# Patient Record
Sex: Male | Born: 1945 | Race: White | Hispanic: No | Marital: Single | Smoking: Former smoker
Health system: Southern US, Community
[De-identification: ages and names within clinical notes are randomized; demographics above are authoritative.]

## PROBLEM LIST (undated history)

## (undated) DIAGNOSIS — Z8489 Family history of other specified conditions: Secondary | ICD-10-CM

## (undated) DIAGNOSIS — I499 Cardiac arrhythmia, unspecified: Secondary | ICD-10-CM

## (undated) DIAGNOSIS — G4730 Sleep apnea, unspecified: Secondary | ICD-10-CM

## (undated) DIAGNOSIS — M109 Gout, unspecified: Secondary | ICD-10-CM

## (undated) DIAGNOSIS — M5136 Other intervertebral disc degeneration, lumbar region: Secondary | ICD-10-CM

## (undated) DIAGNOSIS — Z9289 Personal history of other medical treatment: Secondary | ICD-10-CM

## (undated) DIAGNOSIS — M1990 Unspecified osteoarthritis, unspecified site: Secondary | ICD-10-CM

## (undated) DIAGNOSIS — R0600 Dyspnea, unspecified: Secondary | ICD-10-CM

## (undated) DIAGNOSIS — I10 Essential (primary) hypertension: Secondary | ICD-10-CM

## (undated) DIAGNOSIS — N189 Chronic kidney disease, unspecified: Secondary | ICD-10-CM

## (undated) DIAGNOSIS — G459 Transient cerebral ischemic attack, unspecified: Secondary | ICD-10-CM

## (undated) HISTORY — PX: COLONOSCOPY: SHX5424

## (undated) HISTORY — PX: CHOLECYSTECTOMY: SHX55

## (undated) HISTORY — PX: CARDIAC CATHETERIZATION: SHX172

## (undated) HISTORY — PX: ABLATION: SHX5711

## (undated) HISTORY — PX: BACK SURGERY: SHX140

## (undated) HISTORY — PX: EYE SURGERY: SHX253

## (undated) HISTORY — PX: LEG SURGERY: SHX1003

## (undated) HISTORY — PX: TIBIA FRACTURE SURGERY: SHX806

---

## 2016-06-07 HISTORY — PX: JOINT REPLACEMENT: SHX530

## 2018-01-31 ENCOUNTER — Other Ambulatory Visit: Payer: Self-pay | Admitting: Neurosurgery

## 2018-01-31 DIAGNOSIS — S32009K Unspecified fracture of unspecified lumbar vertebra, subsequent encounter for fracture with nonunion: Secondary | ICD-10-CM

## 2018-02-02 ENCOUNTER — Ambulatory Visit
Admission: RE | Admit: 2018-02-02 | Discharge: 2018-02-02 | Disposition: A | Discharge status: Home / Self Care | Payer: Medicare HMO | Source: Ambulatory Visit | Attending: Neurosurgery | Admitting: Neurosurgery

## 2018-02-02 DIAGNOSIS — S32009K Unspecified fracture of unspecified lumbar vertebra, subsequent encounter for fracture with nonunion: Secondary | ICD-10-CM

## 2018-02-08 ENCOUNTER — Other Ambulatory Visit: Payer: Self-pay | Admitting: Neurosurgery

## 2018-02-17 NOTE — Pre-Procedure Instructions (Signed)
Ryan Stone A Frede  02/17/2018      [id number] - [place], [place] - [address] [place] [id number] Phone: [phone] Fax: [phone]    Your procedure is scheduled on Friday September 20.  [facility] to [facility] Ad[facility] at 8:40 A.M.  Call this number if you have problems the morning of surgery:  [phone]   Remember:  Do not eat or drink after midnight.    Take these medicines the morning of surgery with A SIP OF WATER:   Carvedilol (coreg) Allopurinol (zyloprim) Amiodarone (pacerone) Gabapentin (neurontin) Hydralazine (apresoline) Levothyroxine (synthroid) Isosorbide (Imdur) Dutesteride (Avodart) Eye drops  7 days prior to surgery STOP taking any Aspirin(unless otherwise instructed by your surgeon), Aleve, Naproxen, Ibuprofen, Motrin, Advil, Goody's, BC's, all herbal medications, fish oil, and all vitamins     Do not wear jewelry, make-up or nail polish.  Do not wear lotions, powders, or perfumes, or deodorant.  Do not shave 48 hours prior to surgery.  Men may shave face and neck.  Do not bring valuables to the hospital.  [facility] is not responsible for any belongings or valuables.  Contacts, dentures or bridgework may not be worn into surgery.  Leave your suitcase in the car.  After surgery it may be brought to your room.  For patients admitted to the hospital, discharge time will be determined by your treatment team.  Patients discharged the day of surgery will not be allowed to drive home.   Special instructions:    [facility]- Preparing For Surgery  Before surgery, you can play an important role. Because skin is not sterile, your skin needs to be as free of germs as possible. You can reduce the number of germs on your skin by washing with CHG (chlorahexidine gluconate) Soap before surgery.  CHG is an antiseptic cleaner which kills germs and bonds with the skin to continue killing germs even after washing.    Oral  Hygiene is also important to reduce your risk of infection.  Remember - BRUSH YOUR TEETH THE MORNING OF SURGERY WITH YOUR REGULAR TOOTHPASTE  Please do not use if you have an allergy to CHG or antibacterial soaps. If your skin becomes reddened/irritated stop using the CHG.  Do not shave (including legs and underarms) for at least 48 hours prior to first CHG shower. It is OK to shave your face.  Please follow these instructions carefully.   1. Shower the NIGHT BEFORE SURGERY and the MORNING OF SURGERY with CHG.   2. If you chose to wash your hair, wash your hair first as usual with your normal shampoo.  3. After you shampoo, rinse your hair and body thoroughly to remove the shampoo.  4. Use CHG as you would any other liquid soap. You can apply CHG directly to the skin and wash gently with a scrungie or a clean washcloth.   5. Apply the CHG Soap to your body ONLY FROM THE NECK DOWN.  Do not use on open wounds or open sores. Avoid contact with your eyes, ears, mouth and genitals (private parts). Wash Face and genitals (private parts)  with your normal soap.  6. Wash thoroughly, paying special attention to the area where your surgery will be performed.  7. Thoroughly rinse your body with warm water from the neck down.  8. DO NOT shower/wash with your normal soap after using and rinsing off the CHG Soap.  9. Pat yourself dry with a CLEAN TOWEL.  10. Wear CLEAN PAJAMAS to bed the night before surgery, wear comfortable clothes the morning of surgery  11. Place CLEAN SHEETS on your bed the night of your first shower and DO NOT SLEEP WITH PETS.    Day of Surgery:  Do not apply any deodorants/lotions.  Please wear clean clothes to the hospital/surgery center.   Remember to brush your teeth WITH YOUR REGULAR TOOTHPASTE.    Please read over the following fact sheets that you were given. Coughing and Deep Breathing, MRSA Information and Surgical Site Infection Prevention

## 2018-02-20 ENCOUNTER — Encounter (HOSPITAL_COMMUNITY): Payer: Self-pay

## 2018-02-20 ENCOUNTER — Encounter (HOSPITAL_COMMUNITY)
Admission: RE | Admit: 2018-02-20 | Discharge: 2018-02-20 | Disposition: A | Discharge status: Home / Self Care | Payer: Medicare HMO | Source: Ambulatory Visit | Attending: Neurosurgery | Admitting: Neurosurgery

## 2018-02-20 ENCOUNTER — Other Ambulatory Visit: Payer: Self-pay

## 2018-02-20 DIAGNOSIS — Z8673 Personal history of transient ischemic attack (TIA), and cerebral infarction without residual deficits: Secondary | ICD-10-CM | POA: Diagnosis not present

## 2018-02-20 DIAGNOSIS — N189 Chronic kidney disease, unspecified: Secondary | ICD-10-CM | POA: Diagnosis not present

## 2018-02-20 DIAGNOSIS — M40209 Unspecified kyphosis, site unspecified: Secondary | ICD-10-CM | POA: Diagnosis not present

## 2018-02-20 DIAGNOSIS — Z9841 Cataract extraction status, right eye: Secondary | ICD-10-CM | POA: Insufficient documentation

## 2018-02-20 DIAGNOSIS — M109 Gout, unspecified: Secondary | ICD-10-CM | POA: Insufficient documentation

## 2018-02-20 DIAGNOSIS — I10 Essential (primary) hypertension: Secondary | ICD-10-CM | POA: Diagnosis not present

## 2018-02-20 DIAGNOSIS — Z01818 Encounter for other preprocedural examination: Secondary | ICD-10-CM | POA: Insufficient documentation

## 2018-02-20 DIAGNOSIS — I129 Hypertensive chronic kidney disease with stage 1 through stage 4 chronic kidney disease, or unspecified chronic kidney disease: Secondary | ICD-10-CM | POA: Diagnosis not present

## 2018-02-20 DIAGNOSIS — G4733 Obstructive sleep apnea (adult) (pediatric): Secondary | ICD-10-CM | POA: Diagnosis not present

## 2018-02-20 DIAGNOSIS — Z9842 Cataract extraction status, left eye: Secondary | ICD-10-CM | POA: Insufficient documentation

## 2018-02-20 DIAGNOSIS — M1990 Unspecified osteoarthritis, unspecified site: Secondary | ICD-10-CM | POA: Insufficient documentation

## 2018-02-20 DIAGNOSIS — Z87891 Personal history of nicotine dependence: Secondary | ICD-10-CM | POA: Diagnosis not present

## 2018-02-20 DIAGNOSIS — Z79890 Hormone replacement therapy (postmenopausal): Secondary | ICD-10-CM | POA: Insufficient documentation

## 2018-02-20 DIAGNOSIS — Z79899 Other long term (current) drug therapy: Secondary | ICD-10-CM | POA: Diagnosis not present

## 2018-02-20 DIAGNOSIS — Z981 Arthrodesis status: Secondary | ICD-10-CM | POA: Diagnosis not present

## 2018-02-20 HISTORY — DX: Other intervertebral disc degeneration, lumbar region: M51.36

## 2018-02-20 HISTORY — DX: Sleep apnea, unspecified: G47.30

## 2018-02-20 HISTORY — DX: Essential (primary) hypertension: I10

## 2018-02-20 HISTORY — DX: Dyspnea, unspecified: R06.00

## 2018-02-20 HISTORY — DX: Cardiac arrhythmia, unspecified: I49.9

## 2018-02-20 HISTORY — DX: Family history of other specified conditions: Z84.89

## 2018-02-20 HISTORY — DX: Personal history of other medical treatment: Z92.89

## 2018-02-20 HISTORY — DX: Gout, unspecified: M10.9

## 2018-02-20 HISTORY — DX: Transient cerebral ischemic attack, unspecified: G45.9

## 2018-02-20 HISTORY — DX: Unspecified osteoarthritis, unspecified site: M19.90

## 2018-02-20 HISTORY — DX: Chronic kidney disease, unspecified: N18.9

## 2018-02-20 LAB — BASIC METABOLIC PANEL
ANION GAP: 7 (ref 5–15)
BUN: 18 mg/dL (ref 8–23)
CALCIUM: 9.2 mg/dL (ref 8.9–10.3)
CO2: 31 mmol/L (ref 22–32)
Chloride: 102 mmol/L (ref 98–111)
Creatinine, Ser: 0.93 mg/dL (ref 0.61–1.24)
GLUCOSE: 98 mg/dL (ref 70–99)
POTASSIUM: 3.6 mmol/L (ref 3.5–5.1)
Sodium: 140 mmol/L (ref 135–145)

## 2018-02-20 LAB — CBC WITH DIFFERENTIAL/PLATELET
Abs Immature Granulocytes: 0 10*3/uL (ref 0.0–0.1)
BASOS ABS: 0 10*3/uL (ref 0.0–0.1)
Basophils Relative: 1 %
EOS ABS: 0.1 10*3/uL (ref 0.0–0.7)
EOS PCT: 3 %
HEMATOCRIT: 35.9 % — AB (ref 39.0–52.0)
Hemoglobin: 11.4 g/dL — ABNORMAL LOW (ref 13.0–17.0)
Immature Granulocytes: 1 %
Lymphocytes Relative: 17 %
Lymphs Abs: 0.7 10*3/uL (ref 0.7–4.0)
MCH: 31.8 pg (ref 26.0–34.0)
MCHC: 31.8 g/dL (ref 30.0–36.0)
MCV: 100 fL (ref 78.0–100.0)
MONO ABS: 0.5 10*3/uL (ref 0.1–1.0)
Monocytes Relative: 13 %
Neutro Abs: 2.6 10*3/uL (ref 1.7–7.7)
Neutrophils Relative %: 65 %
Platelets: 171 10*3/uL (ref 150–400)
RBC: 3.59 MIL/uL — AB (ref 4.22–5.81)
RDW: 15.8 % — AB (ref 11.5–15.5)
WBC: 4 10*3/uL (ref 4.0–10.5)

## 2018-02-20 LAB — SURGICAL PCR SCREEN
MRSA, PCR: NEGATIVE
STAPHYLOCOCCUS AUREUS: NEGATIVE

## 2018-02-20 LAB — ABO/RH: ABO/RH(D): O POS

## 2018-02-20 LAB — TYPE AND SCREEN
ABO/RH(D): O POS
ANTIBODY SCREEN: NEGATIVE

## 2018-02-20 NOTE — Pre-Procedure Instructions (Addendum)
Ryan [name]  02/20/2018      [id number] - [place], [place] - [address] [place] [address] Phone: [phone] Fax: [phone]    Your procedure is scheduled on Friday September 20.  Report to [name] Admitting at 8:40 A.M.  Call this number if you have problems the morning of surgery:  [phone]   Remember:  Do not eat or drink after midnight Thursday, September 19.    Take these medicines the morning of surgery with A SIP OF WATER:   Carvedilol (coreg) Allopurinol (zyloprim) Amiodarone (pacerone) Gabapentin (neurontin) Hydralazine (apresoline) Levothyroxine (synthroid) Isosorbide (Imdur) Dutesteride (Avodart) Eye drops    7 days prior to surgery STOP taking any Aspirin(unless otherwise instructed by your surgeon), Aleve, Naproxen, Ibuprofen, Motrin, Advil, Goody's, BC's, all herbal medications, fish oil, and all vitamins  Special instructions:    [name]- Preparing For Surgery  Before surgery, you can play an important role. Because skin is not sterile, your skin needs to be as free of germs as possible. You can reduce the number of germs on your skin by washing with CHG (chlorahexidine gluconate) Soap before surgery.  CHG is an antiseptic cleaner which kills germs and bonds with the skin to continue killing germs even after washing.    Oral Hygiene is also important to reduce your risk of infection.  Remember - BRUSH YOUR TEETH THE MORNING OF SURGERY WITH YOUR REGULAR TOOTHPASTE  Please do not use if you have an allergy to CHG or antibacterial soaps. If your skin becomes reddened/irritated stop using the CHG.  Do not shave (including legs and underarms) for at least 48 hours prior to first CHG shower. It is OK to shave your face.  Please follow these instructions carefully.   1. Shower the NIGHT BEFORE SURGERY and the MORNING OF SURGERY with CHG.   2. If you chose to wash your hair, wash your hair first as usual  with your normal shampoo.  3. After you shampoo, rinse your hair and body thoroughly to remove the shampoo.  4. Use CHG as you would any other liquid soap. You can apply CHG directly to the skin and wash gently with a scrungie or a clean washcloth.   5. Apply the CHG Soap to your body ONLY FROM THE NECK DOWN.  Do not use on open wounds or open sores. Avoid contact with your eyes, ears, mouth and genitals (private parts). Wash Face and genitals (private parts)  with your normal soap.  6. Wash thoroughly, paying special attention to the area where your surgery will be performed.  7. Thoroughly rinse your body with warm water from the neck down.  8. DO NOT shower/wash with your normal soap after using and rinsing off the CHG Soap.  9. Pat yourself dry with a CLEAN TOWEL.  10. Wear CLEAN PAJAMAS to bed the night before surgery, wear comfortable clothes the morning of surgery  11. Place CLEAN [name] on your bed the night of your first shower and DO NOT SLEEP WITH PETS.  Day of Surgery:  Shower as above. Do not apply any deodorants/lotions, powders or colognes. Please wear clean clothes to the [name]/surgery center.   Remember to brush your teeth WITH YOUR REGULAR TOOTHPASTE.             Do not wear jewelry, make-up or nail polish.  Do not save 48 hours prior to surgery.  Men may shave face and neck.  Do  not bring valuables to the [name].  [name] is not responsible for any belongings or valuables.  Contacts, dentures or bridgework may not be worn into surgery.  Leave your suitcase in the car.  After surgery it may be brought to your room.  For patients admitted to the [name], discharge time will be determined by your treatment team.  Patients discharged the day of surgery will not be allowed to drive home.    Contacts, dentures or bridgework may not be worn into surgery.  Leave your suitcase in the car.  After surgery it may be brought to your room.  For patients admitted to  the [name], discharge time will be determined by your treatment team.  Patients discharged the day of surgery will not be allowed to drive home.    Please read over the following fact [name] that you were given. Coughing and Deep Breathing, MRSA Information and Surgical Site Infection Prevention

## 2018-02-20 NOTE — Progress Notes (Addendum)
Ryan Ryan [name] denies chest pain or shortness of breath.  Patient has a history of Afib/Aflutter, had a successful cardioversion.  Cardiologist is Dr Willeen CassWahid. PCP is Dr Nolen MuMcKInney.  Patient lives alone, walks with a cane.  Son, Ryan [name] helps as needed with shower.  Ryan [name]'s blood pressures on arrival to PAT left arm; 204/80, 204/90; right Arm 210/73.  At the end of PAT visit 198/73.  Patient reports that he did not take blood pressure medications this am

## 2018-02-21 NOTE — Progress Notes (Signed)
Anesthesia Chart Review:  Case:  [id number] Date/Time:  02/24/18 1025   Procedure:  Posterior Lateral Fusion T9 - L3-4 (N/A Back)   Anesthesia type:  General   Pre-op diagnosis:  Kyphosis   Location:  [facility] ROOM 19 / [facility] OR   Surgeon:  Julio Sicks, MD      DISCUSSION: 72 yo male former smoker for above procedure. Pertinent hx includes HTN, Afib/flutter (s/p cardioversion 2018), OSA not on CPAP, TIA, DOE, s/p bilateral cataract extractions 02/14/18 and 01/24/18 at [facility] without complications.  Pt recently admitted to [facility] 5/4-5/04/2018 for TIA, Opioid dependence with withdrawal, Slurred speech, Acute kidney injury, Hyponatremia, Acute hypercapnic respiratory failure, Essential hypertension/hypertensive urgency. CT head showed no acute abnormalities.  CTA head and neck showed areas of stenosis involving proximal bilateral vertebral artery and proximal left ICA at less than 50%; no large vessel occlusion or high-grade stenosis.  Patient's symptoms of slurred speech improved, however he continued to have some confusion and agitation. Pain medications were initially held with prn IV Lorazepam for agitation.  Patient's mental status improved gradually. He was evaluated by Psychiatry and was restarted on low dose opioids to prevent withdrawals. Patient was discharged on low-dose Oxycodone and was instructed to f/u with the Pain Clinic.   Significantly elevated BP at PAT. Pt reported he has not taken BP meds that morning. Pt last seen by cardiology 11/01/2017. Notes in care everywhere indicate at that time his HTN was well controlled. EKG with sinus brady and first degree HB noted.  Anticipate he can proceed with surgery as planned barring acute status change.    VS: BP (!) 204/96 Comment: taken manually in L arm/large cuff/notified Jan RN  Pulse (!) 56   Temp (!) 36.3 C   Resp 20   Ht 5\' 11"  (1.803 m)   Wt 102.1 kg   SpO2 99%   BMI 31.38 kg/m   PROVIDERS: Margaree Mackintosh, MD is  PCP  Lorelei Pont, MD is Cardiologist last seen 11/01/2017  LABS: Labs reviewed: Acceptable for surgery. (all labs ordered are listed, but only abnormal results are displayed)  Labs Reviewed  CBC WITH DIFFERENTIAL/PLATELET - Abnormal; Notable for the following components:      Result Value   RBC 3.59 (*)    Hemoglobin 11.4 (*)    HCT 35.9 (*)    RDW 15.8 (*)    All other components within normal limits  SURGICAL PCR SCREEN  BASIC METABOLIC PANEL  TYPE AND SCREEN  ABO/RH     IMAGES: XR CHEST PA AND LATERAL 11/02/2017 INDICATION: Weakness COMPARISON: CT 10/09/2017. Chest x-ray 11/02/2017 1 hour prior TECHNIQUE: Frontal and lateral chest radiographs performed.   FINDINGS:   LUNGS:  Minimal vascular congestion in lung bases. No focal consolidation. Trace pleural effusion. PNEUMOTHORAX: None.   HEART SIZE: Cardiomegaly.    IMPRESSION: Cardiomegaly and minimal vascular congestion.  EKG: 11/02/2017 (outside record, copy on pt chart): Sinus rhythm with 1st degree AV block. Nonspecific intraventricular conduction delay.  CV: TTE 10/10/2017: Interpretation Summary A complete two-dimensional transthoracic echocardiogram was performed (2D, M-mode, Doppler and color flow Doppler). There is mild concentric left ventricular hypertrophy. Proximal septal thickening is noted. Regional wall motion abnormalities cannot be excluded due to limited visualization. The left ventricle is hyperdynamic. Ejection Fraction = >55%.  Left Ventricle The left ventricle is normal in size. There is no thrombus. There is mild concentric left ventricular hypertrophy. Proximal septal thickening is noted. The left ventricle is hyperdynamic. Ejection Fraction = >  55%. The transmitral spectral Doppler flow pattern is suggestive of impaired LV relaxation. Regional wall motion abnormalities cannot be excluded due to limited visualization.   Right Ventricle The right ventricle is normal in size and  function.  Atria The left atrial size is normal. Right atrial size is normal. The interatrial septum is intact with no evidence for an atrial septal defect.  Mitral Valve The mitral valve is grossly normal. There is trace mitral regurgitation.   Tricuspid Valve The tricuspid valve leaflets are thin and pliable. There is trace tricuspid regurgitation.  Aortic Valve The aortic valve opens well. The aortic valve is sclerotic with no evidence of stenosis. No hemodynamically significant valvular aortic stenosis. No aortic regurgitation is present.  Pulmonic Valve The pulmonic valve is not well visualized.  Great Vessels The aortic root is normal size.  Pericardium/Pleural There is no pericardial effusion.  Past Medical History:  Diagnosis Date  . Arthritis   . Chronic kidney disease    AKI  . DDD (degenerative disc disease), lumbar   . Dyspnea    with exertion  . Dysrhythmia    Afib/ Afullter  . Family history of adverse reaction to anesthesia    sister- N/V  . Gout   . History of blood transfusion   . Hypertension   . Sleep apnea    does not use CPAP  . TIA (transient ischemic attack)     Past Surgical History:  Procedure Laterality Date  . ABLATION    . BACK SURGERY      x 2 Fusion x 1  . CARDIAC CATHETERIZATION    . CHOLECYSTECTOMY    . COLONOSCOPY    . EYE SURGERY Bilateral    Cataract  . JOINT REPLACEMENT Left 2018   Knee  . LEG SURGERY Left    Rod removed  . TIBIA FRACTURE SURGERY Left    rod placed    MEDICATIONS: . allopurinol (ZYLOPRIM) 300 MG tablet  . amiodarone (PACERONE) 200 MG tablet  . atorvastatin (LIPITOR) 40 MG tablet  . BESIVANCE 0.6 % SUSP  . carvedilol (COREG) 25 MG tablet  . cloNIDine (CATAPRES - DOSED IN MG/24 HR) 0.2 mg/24hr patch  . DUREZOL 0.05 % EMUL  . dutasteride (AVODART) 0.5 MG capsule  . gabapentin (NEURONTIN) 100 MG capsule  . hydrALAZINE (APRESOLINE) 100 MG tablet  . isosorbide mononitrate (IMDUR) 120 MG 24 hr  tablet  . levothyroxine (SYNTHROID, LEVOTHROID) 100 MCG tablet  . losartan (COZAAR) 100 MG tablet  . PROLENSA 0.07 % SOLN  . tamsulosin (FLOMAX) 0.4 MG CAPS capsule   No current facility-administered medications for this encounter.     Zannie CoveJames Jian Hodgman, PA-C [facility] Stay [facility]/Anesthesiology Phone [phone] 02/21/2018 12:30 PM

## 2018-02-23 NOTE — Anesthesia Preprocedure Evaluation (Addendum)
Anesthesia Evaluation  Patient identified by MRN, date of birth, ID band Patient awake    Reviewed: Allergy & Precautions, H&P , NPO status , Patient's Chart, lab work & pertinent test results  Airway Mallampati: II  TM Distance: >3 FB Neck ROM: Full    Dental no notable dental hx. (+) Edentulous Upper, Edentulous Lower, Dental Advisory Given   Pulmonary sleep apnea and Continuous Positive Airway Pressure Ventilation , former smoker,    Pulmonary exam normal breath sounds clear to auscultation       Cardiovascular Exercise Tolerance: Good hypertension, Pt. on medications and Pt. on home beta blockers + dysrhythmias  Rhythm:Regular Rate:Normal     Neuro/Psych TIAnegative psych ROS   GI/Hepatic negative GI ROS, Neg liver ROS,   Endo/Other  negative endocrine ROS  Renal/GU negative Renal ROS  negative genitourinary   Musculoskeletal  (+) Arthritis , Osteoarthritis,    Abdominal   Peds  Hematology negative hematology ROS (+)   Anesthesia Other Findings   Reproductive/Obstetrics negative OB ROS                            Anesthesia Physical Anesthesia Plan  ASA: III  Anesthesia Plan: General   Post-op Pain Management:    Induction: Intravenous  PONV Risk Score and Plan: 3 and Ondansetron, Dexamethasone and Midazolam  Airway Management Planned: Oral ETT  Additional Equipment:   Intra-op Plan:   Post-operative Plan: Extubation in OR  Informed Consent: I have reviewed the patients History and Physical, chart, labs and discussed the procedure including the risks, benefits and alternatives for the proposed anesthesia with the patient or authorized representative who has indicated his/her understanding and acceptance.   Dental advisory given  Plan Discussed with: CRNA  Anesthesia Plan Comments:         Anesthesia Quick Evaluation

## 2018-02-24 ENCOUNTER — Encounter (HOSPITAL_COMMUNITY): Payer: Self-pay | Admitting: Certified Registered Nurse Anesthetist

## 2018-02-24 ENCOUNTER — Inpatient Hospital Stay (HOSPITAL_COMMUNITY): Payer: Medicare HMO | Admitting: Anesthesiology

## 2018-02-24 ENCOUNTER — Inpatient Hospital Stay (HOSPITAL_COMMUNITY)
Admission: RE | Admit: 2018-02-24 | Discharge: 2018-03-03 | DRG: 454 | Disposition: A | Discharge status: Skilled Nursing Facility | Payer: Medicare HMO | Source: Ambulatory Visit | Attending: Neurosurgery | Admitting: Neurosurgery

## 2018-02-24 ENCOUNTER — Inpatient Hospital Stay (HOSPITAL_COMMUNITY): Payer: Medicare HMO

## 2018-02-24 ENCOUNTER — Inpatient Hospital Stay (HOSPITAL_COMMUNITY)
Admission: RE | Disposition: A | Discharge status: Skilled Nursing Facility | Payer: Self-pay | Source: Ambulatory Visit | Attending: Neurosurgery

## 2018-02-24 ENCOUNTER — Inpatient Hospital Stay (HOSPITAL_COMMUNITY): Payer: Medicare HMO | Admitting: Physician Assistant

## 2018-02-24 ENCOUNTER — Other Ambulatory Visit: Payer: Self-pay

## 2018-02-24 DIAGNOSIS — N189 Chronic kidney disease, unspecified: Secondary | ICD-10-CM | POA: Diagnosis present

## 2018-02-24 DIAGNOSIS — M1990 Unspecified osteoarthritis, unspecified site: Secondary | ICD-10-CM | POA: Diagnosis present

## 2018-02-24 DIAGNOSIS — M4000 Postural kyphosis, site unspecified: Secondary | ICD-10-CM | POA: Diagnosis present

## 2018-02-24 DIAGNOSIS — Z419 Encounter for procedure for purposes other than remedying health state, unspecified: Secondary | ICD-10-CM

## 2018-02-24 DIAGNOSIS — M109 Gout, unspecified: Secondary | ICD-10-CM | POA: Diagnosis present

## 2018-02-24 DIAGNOSIS — Z87891 Personal history of nicotine dependence: Secondary | ICD-10-CM | POA: Diagnosis not present

## 2018-02-24 DIAGNOSIS — Z8673 Personal history of transient ischemic attack (TIA), and cerebral infarction without residual deficits: Secondary | ICD-10-CM | POA: Diagnosis not present

## 2018-02-24 DIAGNOSIS — T8489XA Other specified complication of internal orthopedic prosthetic devices, implants and grafts, initial encounter: Secondary | ICD-10-CM | POA: Diagnosis present

## 2018-02-24 DIAGNOSIS — I129 Hypertensive chronic kidney disease with stage 1 through stage 4 chronic kidney disease, or unspecified chronic kidney disease: Secondary | ICD-10-CM | POA: Diagnosis present

## 2018-02-24 DIAGNOSIS — Z79899 Other long term (current) drug therapy: Secondary | ICD-10-CM

## 2018-02-24 DIAGNOSIS — Y838 Other surgical procedures as the cause of abnormal reaction of the patient, or of later complication, without mention of misadventure at the time of the procedure: Secondary | ICD-10-CM | POA: Diagnosis present

## 2018-02-24 DIAGNOSIS — Z888 Allergy status to other drugs, medicaments and biological substances status: Secondary | ICD-10-CM | POA: Diagnosis not present

## 2018-02-24 DIAGNOSIS — G4730 Sleep apnea, unspecified: Secondary | ICD-10-CM | POA: Diagnosis present

## 2018-02-24 DIAGNOSIS — M549 Dorsalgia, unspecified: Secondary | ICD-10-CM | POA: Diagnosis present

## 2018-02-24 DIAGNOSIS — I4891 Unspecified atrial fibrillation: Secondary | ICD-10-CM | POA: Diagnosis present

## 2018-02-24 DIAGNOSIS — Z981 Arthrodesis status: Secondary | ICD-10-CM

## 2018-02-24 DIAGNOSIS — M40209 Unspecified kyphosis, site unspecified: Secondary | ICD-10-CM | POA: Diagnosis present

## 2018-02-24 DIAGNOSIS — Z79890 Hormone replacement therapy (postmenopausal): Secondary | ICD-10-CM | POA: Diagnosis not present

## 2018-02-24 HISTORY — PX: LAMINECTOMY WITH POSTERIOR LATERAL ARTHRODESIS LEVEL 4: SHX6338

## 2018-02-24 SURGERY — LAMINECTOMY WITH POSTERIOR LATERAL ARTHRODESIS LEVEL 4
Anesthesia: General | Site: Back

## 2018-02-24 MED ORDER — CLONIDINE HCL 0.2 MG/24HR TD PTWK
0.2000 mg | MEDICATED_PATCH | TRANSDERMAL | Status: DC
  Administered 2018-02-24 – 2018-03-03 (×2): 0.2 mg via TRANSDERMAL
  Filled 2018-02-24 (×2): qty 1

## 2018-02-24 MED ORDER — ACETAMINOPHEN 650 MG RE SUPP: 650.0000 mg | RECTAL | Status: DC | PRN

## 2018-02-24 MED ORDER — AMIODARONE HCL 200 MG PO TABS
200.0000 mg | ORAL_TABLET | Freq: Every day | ORAL | Status: DC
  Administered 2018-02-25 – 2018-03-03 (×7): 200 mg via ORAL
  Filled 2018-02-24 (×7): qty 1

## 2018-02-24 MED ORDER — PHENOL 1.4 % MT LIQD: 1.0000 | OROMUCOSAL | Status: DC | PRN

## 2018-02-24 MED ORDER — HYDROMORPHONE HCL 1 MG/ML IJ SOLN
INTRAMUSCULAR | Status: AC
  Filled 2018-02-24: qty 1

## 2018-02-24 MED ORDER — KETOROLAC TROMETHAMINE 0.5 % OP SOLN
1.0000 [drp] | Freq: Four times a day (QID) | OPHTHALMIC | Status: DC
  Administered 2018-02-24 – 2018-03-03 (×27): 1 [drp] via OPHTHALMIC
  Filled 2018-02-24: qty 5

## 2018-02-24 MED ORDER — FENTANYL CITRATE (PF) 250 MCG/5ML IJ SOLN
INTRAMUSCULAR | Status: DC | PRN
  Administered 2018-02-24 (×5): 50 ug via INTRAVENOUS

## 2018-02-24 MED ORDER — FLEET ENEMA 7-19 GM/118ML RE ENEM
1.0000 | ENEMA | Freq: Once | RECTAL | Status: AC | PRN
  Administered 2018-03-03: 1 via RECTAL
  Filled 2018-02-24: qty 1

## 2018-02-24 MED ORDER — HYDROMORPHONE HCL 1 MG/ML IJ SOLN
1.0000 mg | INTRAMUSCULAR | Status: DC | PRN
  Administered 2018-02-24 – 2018-02-26 (×6): 1 mg via INTRAVENOUS
  Filled 2018-02-24 (×6): qty 1

## 2018-02-24 MED ORDER — ONDANSETRON HCL 4 MG PO TABS: 4.0000 mg | ORAL_TABLET | Freq: Four times a day (QID) | ORAL | Status: DC | PRN

## 2018-02-24 MED ORDER — DIFLUPREDNATE 0.05 % OP EMUL: 1.0000 [drp] | Freq: Three times a day (TID) | OPHTHALMIC | Status: DC

## 2018-02-24 MED ORDER — POLYETHYLENE GLYCOL 3350 17 G PO PACK
17.0000 g | PACK | Freq: Every day | ORAL | Status: DC | PRN
  Administered 2018-02-27 – 2018-03-01 (×3): 17 g via ORAL
  Filled 2018-02-24 (×3): qty 1

## 2018-02-24 MED ORDER — BUPIVACAINE HCL (PF) 0.25 % IJ SOLN
INTRAMUSCULAR | Status: AC
  Filled 2018-02-24: qty 30

## 2018-02-24 MED ORDER — VANCOMYCIN HCL 1000 MG IV SOLR
INTRAVENOUS | Status: AC
  Filled 2018-02-24: qty 1000

## 2018-02-24 MED ORDER — ACETAMINOPHEN 325 MG PO TABS
650.0000 mg | ORAL_TABLET | ORAL | Status: DC | PRN
  Administered 2018-03-01 – 2018-03-02 (×2): 650 mg via ORAL
  Filled 2018-02-24 (×2): qty 2

## 2018-02-24 MED ORDER — THROMBIN (RECOMBINANT) 20000 UNITS EX SOLR
CUTANEOUS | Status: AC
  Filled 2018-02-24: qty 20000

## 2018-02-24 MED ORDER — SODIUM CHLORIDE 0.9 % IV SOLN
INTRAVENOUS | Status: DC | PRN
  Administered 2018-02-24: 13:00:00

## 2018-02-24 MED ORDER — OXYCODONE HCL 5 MG PO TABS
10.0000 mg | ORAL_TABLET | ORAL | Status: DC | PRN
  Administered 2018-02-24 – 2018-02-26 (×7): 10 mg via ORAL
  Administered 2018-02-27: 5 mg via ORAL
  Administered 2018-02-27 – 2018-03-02 (×8): 10 mg via ORAL
  Filled 2018-02-24 (×17): qty 2

## 2018-02-24 MED ORDER — FENTANYL CITRATE (PF) 250 MCG/5ML IJ SOLN
INTRAMUSCULAR | Status: AC
  Filled 2018-02-24: qty 5

## 2018-02-24 MED ORDER — LOTEPREDNOL ETABONATE 0.5 % OP SUSP
1.0000 [drp] | Freq: Three times a day (TID) | OPHTHALMIC | Status: DC
  Administered 2018-02-24 – 2018-03-03 (×20): 1 [drp] via OPHTHALMIC
  Filled 2018-02-24 (×3): qty 5

## 2018-02-24 MED ORDER — MIDAZOLAM HCL 2 MG/2ML IJ SOLN
INTRAMUSCULAR | Status: AC
  Filled 2018-02-24: qty 2

## 2018-02-24 MED ORDER — ROCURONIUM BROMIDE 10 MG/ML (PF) SYRINGE
PREFILLED_SYRINGE | INTRAVENOUS | Status: DC | PRN
  Administered 2018-02-24: 30 mg via INTRAVENOUS
  Administered 2018-02-24: 20 mg via INTRAVENOUS
  Administered 2018-02-24 (×2): 50 mg via INTRAVENOUS

## 2018-02-24 MED ORDER — DEXAMETHASONE SODIUM PHOSPHATE 10 MG/ML IJ SOLN
10.0000 mg | INTRAMUSCULAR | Status: AC
  Administered 2018-02-24: 10 mg via INTRAVENOUS
  Filled 2018-02-24: qty 1

## 2018-02-24 MED ORDER — MENTHOL 3 MG MT LOZG: 1.0000 | LOZENGE | OROMUCOSAL | Status: DC | PRN

## 2018-02-24 MED ORDER — ATORVASTATIN CALCIUM 40 MG PO TABS
40.0000 mg | ORAL_TABLET | Freq: Every day | ORAL | Status: DC
  Administered 2018-02-24 – 2018-03-02 (×7): 40 mg via ORAL
  Filled 2018-02-24 (×7): qty 1

## 2018-02-24 MED ORDER — SODIUM CHLORIDE 0.9 % IV SOLN: 250.0000 mL | INTRAVENOUS | Status: DC

## 2018-02-24 MED ORDER — ALBUMIN HUMAN 5 % IV SOLN
INTRAVENOUS | Status: DC | PRN
  Administered 2018-02-24: 15:00:00 via INTRAVENOUS

## 2018-02-24 MED ORDER — 0.9 % SODIUM CHLORIDE (POUR BTL) OPTIME
TOPICAL | Status: DC | PRN
  Administered 2018-02-24: 1000 mL

## 2018-02-24 MED ORDER — CHLORHEXIDINE GLUCONATE CLOTH 2 % EX PADS: 6.0000 | MEDICATED_PAD | Freq: Once | CUTANEOUS | Status: DC

## 2018-02-24 MED ORDER — VANCOMYCIN HCL 1000 MG IV SOLR
INTRAVENOUS | Status: DC | PRN
  Administered 2018-02-24: 1000 mg via TOPICAL

## 2018-02-24 MED ORDER — THROMBIN 20000 UNITS EX SOLR
CUTANEOUS | Status: DC | PRN
  Administered 2018-02-24: 13:00:00 via TOPICAL

## 2018-02-24 MED ORDER — DIAZEPAM 5 MG PO TABS
ORAL_TABLET | ORAL | Status: AC
  Filled 2018-02-24: qty 1

## 2018-02-24 MED ORDER — DUTASTERIDE 0.5 MG PO CAPS
0.5000 mg | ORAL_CAPSULE | Freq: Every day | ORAL | Status: DC
  Administered 2018-02-24 – 2018-03-03 (×8): 0.5 mg via ORAL
  Filled 2018-02-24 (×8): qty 1

## 2018-02-24 MED ORDER — LIDOCAINE 2% (20 MG/ML) 5 ML SYRINGE
INTRAMUSCULAR | Status: DC | PRN
  Administered 2018-02-24: 80 mg via INTRAVENOUS

## 2018-02-24 MED ORDER — ALLOPURINOL 300 MG PO TABS
300.0000 mg | ORAL_TABLET | Freq: Every day | ORAL | Status: DC
  Administered 2018-02-25 – 2018-03-03 (×7): 300 mg via ORAL
  Filled 2018-02-24 (×7): qty 1

## 2018-02-24 MED ORDER — TAMSULOSIN HCL 0.4 MG PO CAPS
0.4000 mg | ORAL_CAPSULE | Freq: Every day | ORAL | Status: DC
  Administered 2018-02-24 – 2018-03-02 (×7): 0.4 mg via ORAL
  Filled 2018-02-24 (×7): qty 1

## 2018-02-24 MED ORDER — CARVEDILOL 25 MG PO TABS
25.0000 mg | ORAL_TABLET | Freq: Two times a day (BID) | ORAL | Status: DC
  Administered 2018-02-24 – 2018-03-03 (×14): 25 mg via ORAL
  Filled 2018-02-24 (×14): qty 1

## 2018-02-24 MED ORDER — BISACODYL 10 MG RE SUPP: 10.0000 mg | Freq: Every day | RECTAL | Status: DC | PRN

## 2018-02-24 MED ORDER — CEFAZOLIN SODIUM-DEXTROSE 2-4 GM/100ML-% IV SOLN
2.0000 g | INTRAVENOUS | Status: AC
  Administered 2018-02-24 (×2): 2 g via INTRAVENOUS
  Filled 2018-02-24: qty 100

## 2018-02-24 MED ORDER — SODIUM CHLORIDE 0.9% FLUSH: 3.0000 mL | INTRAVENOUS | Status: DC | PRN

## 2018-02-24 MED ORDER — HYDRALAZINE HCL 100 MG PO TABS: 100.0000 mg | ORAL_TABLET | Freq: Every day | ORAL | Status: DC

## 2018-02-24 MED ORDER — SODIUM CHLORIDE 0.9% FLUSH
3.0000 mL | Freq: Two times a day (BID) | INTRAVENOUS | Status: DC
  Administered 2018-02-24 – 2018-03-03 (×13): 3 mL via INTRAVENOUS

## 2018-02-24 MED ORDER — ONDANSETRON HCL 4 MG/2ML IJ SOLN: 4.0000 mg | Freq: Four times a day (QID) | INTRAMUSCULAR | Status: DC | PRN

## 2018-02-24 MED ORDER — HYDROMORPHONE HCL 1 MG/ML IJ SOLN
0.2500 mg | INTRAMUSCULAR | Status: DC | PRN
  Administered 2018-02-24 (×2): 0.5 mg via INTRAVENOUS

## 2018-02-24 MED ORDER — MIDAZOLAM HCL 2 MG/2ML IJ SOLN: INTRAMUSCULAR | Status: DC | PRN

## 2018-02-24 MED ORDER — LACTATED RINGERS IV SOLN
INTRAVENOUS | Status: DC | PRN
  Administered 2018-02-24 (×2): via INTRAVENOUS

## 2018-02-24 MED ORDER — HYDROCODONE-ACETAMINOPHEN 10-325 MG PO TABS
1.0000 | ORAL_TABLET | ORAL | Status: DC | PRN
  Administered 2018-02-27 – 2018-03-03 (×4): 1 via ORAL
  Filled 2018-02-24 (×4): qty 1

## 2018-02-24 MED ORDER — ONDANSETRON HCL 4 MG/2ML IJ SOLN
INTRAMUSCULAR | Status: DC | PRN
  Administered 2018-02-24: 4 mg via INTRAVENOUS

## 2018-02-24 MED ORDER — PROPOFOL 10 MG/ML IV BOLUS
INTRAVENOUS | Status: DC | PRN
  Administered 2018-02-24: 150 mg via INTRAVENOUS

## 2018-02-24 MED ORDER — LOSARTAN POTASSIUM 50 MG PO TABS
100.0000 mg | ORAL_TABLET | Freq: Every day | ORAL | Status: DC
  Administered 2018-02-24 – 2018-03-03 (×8): 100 mg via ORAL
  Filled 2018-02-24 (×8): qty 2

## 2018-02-24 MED ORDER — SODIUM CHLORIDE 0.9 % IV SOLN
INTRAVENOUS | Status: DC | PRN
  Administered 2018-02-24: 25 ug/min via INTRAVENOUS

## 2018-02-24 MED ORDER — GABAPENTIN 100 MG PO CAPS
100.0000 mg | ORAL_CAPSULE | Freq: Three times a day (TID) | ORAL | Status: DC
  Administered 2018-02-24 – 2018-03-03 (×20): 100 mg via ORAL
  Filled 2018-02-24 (×20): qty 1

## 2018-02-24 MED ORDER — GATIFLOXACIN 0.5 % OP SOLN
1.0000 [drp] | Freq: Four times a day (QID) | OPHTHALMIC | Status: DC
  Administered 2018-02-24 – 2018-03-03 (×27): 1 [drp] via OPHTHALMIC
  Filled 2018-02-24: qty 2.5

## 2018-02-24 MED ORDER — PROPOFOL 10 MG/ML IV BOLUS
INTRAVENOUS | Status: AC
  Filled 2018-02-24: qty 20

## 2018-02-24 MED ORDER — HYDRALAZINE HCL 50 MG PO TABS
100.0000 mg | ORAL_TABLET | Freq: Three times a day (TID) | ORAL | Status: DC
  Administered 2018-02-24 – 2018-03-02 (×15): 100 mg via ORAL
  Filled 2018-02-24 (×17): qty 2

## 2018-02-24 MED ORDER — CEFAZOLIN SODIUM-DEXTROSE 1-4 GM/50ML-% IV SOLN
1.0000 g | Freq: Three times a day (TID) | INTRAVENOUS | Status: AC
  Administered 2018-02-24 – 2018-02-25 (×2): 1 g via INTRAVENOUS
  Filled 2018-02-24 (×2): qty 50

## 2018-02-24 MED ORDER — DIAZEPAM 5 MG PO TABS
5.0000 mg | ORAL_TABLET | Freq: Four times a day (QID) | ORAL | Status: DC | PRN
  Administered 2018-02-24 – 2018-02-25 (×3): 5 mg via ORAL
  Administered 2018-02-26: 10 mg via ORAL
  Administered 2018-02-26 (×2): 5 mg via ORAL
  Administered 2018-02-27: 10 mg via ORAL
  Administered 2018-02-27: 5 mg via ORAL
  Filled 2018-02-24: qty 2
  Filled 2018-02-24: qty 1
  Filled 2018-02-24: qty 2
  Filled 2018-02-24: qty 1
  Filled 2018-02-24: qty 2
  Filled 2018-02-24 (×3): qty 1

## 2018-02-24 MED ORDER — ISOSORBIDE MONONITRATE ER 60 MG PO TB24
120.0000 mg | ORAL_TABLET | Freq: Every day | ORAL | Status: DC
  Administered 2018-02-25 – 2018-03-03 (×6): 120 mg via ORAL
  Filled 2018-02-24 (×7): qty 2

## 2018-02-24 MED ORDER — BUPIVACAINE HCL (PF) 0.25 % IJ SOLN
INTRAMUSCULAR | Status: DC | PRN
  Administered 2018-02-24: 30 mL

## 2018-02-24 MED ORDER — ONDANSETRON HCL 4 MG/2ML IJ SOLN
INTRAMUSCULAR | Status: AC
  Filled 2018-02-24: qty 2

## 2018-02-24 MED ORDER — CEFAZOLIN SODIUM 1 G IJ SOLR
INTRAMUSCULAR | Status: AC
  Filled 2018-02-24: qty 20

## 2018-02-24 MED ORDER — THROMBIN 5000 UNITS EX SOLR
CUTANEOUS | Status: AC
  Filled 2018-02-24: qty 5000

## 2018-02-24 MED ORDER — LACTATED RINGERS IV SOLN
INTRAVENOUS | Status: DC
  Administered 2018-02-24: 09:00:00 via INTRAVENOUS

## 2018-02-24 MED ORDER — SUGAMMADEX SODIUM 200 MG/2ML IV SOLN
INTRAVENOUS | Status: DC | PRN
  Administered 2018-02-24: 300 mg via INTRAVENOUS

## 2018-02-24 MED ORDER — LEVOTHYROXINE SODIUM 100 MCG PO TABS
100.0000 ug | ORAL_TABLET | Freq: Every day | ORAL | Status: DC
  Administered 2018-02-25 – 2018-03-03 (×7): 100 ug via ORAL
  Filled 2018-02-24 (×6): qty 1

## 2018-02-24 SURGICAL SUPPLY — 81 items
BAG DECANTER FOR FLEXI CONT (MISCELLANEOUS) ×3 IMPLANT
BENZOIN TINCTURE PRP APPL 2/3 (GAUZE/BANDAGES/DRESSINGS) ×3 IMPLANT
BLADE CLIPPER SURG (BLADE) IMPLANT
BUR CUTTER 7.0 ROUND (BURR) ×3 IMPLANT
BUR MATCHSTICK NEURO 3.0 LAGG (BURR) ×3 IMPLANT
CANISTER SUCT 3000ML PPV (MISCELLANEOUS) ×3 IMPLANT
CARTRIDGE OIL MAESTRO DRILL (MISCELLANEOUS) ×1 IMPLANT
CLOSURE WOUND 1/2 X4 (GAUZE/BANDAGES/DRESSINGS) ×1
CONNECTOR LAT CLSD 5.5X90 (Rod) ×12 IMPLANT
CONT SPEC 4OZ CLIKSEAL STRL BL (MISCELLANEOUS) ×3 IMPLANT
COVER BACK TABLE 60X90IN (DRAPES) ×3 IMPLANT
DECANTER SPIKE VIAL GLASS SM (MISCELLANEOUS) ×3 IMPLANT
DERMABOND ADVANCED (GAUZE/BANDAGES/DRESSINGS) ×2
DERMABOND ADVANCED .7 DNX12 (GAUZE/BANDAGES/DRESSINGS) ×1 IMPLANT
DIFFUSER DRILL AIR PNEUMATIC (MISCELLANEOUS) ×3 IMPLANT
DRAPE C-ARM 42X72 X-RAY (DRAPES) ×6 IMPLANT
DRAPE HALF SHEET 40X57 (DRAPES) IMPLANT
DRAPE LAPAROTOMY 100X72X124 (DRAPES) ×3 IMPLANT
DRAPE POUCH INSTRU U-SHP 10X18 (DRAPES) IMPLANT
DRAPE SURG 17X23 STRL (DRAPES) ×12 IMPLANT
DRSG OPSITE POSTOP 4X10 (GAUZE/BANDAGES/DRESSINGS) ×3 IMPLANT
DRSG OPSITE POSTOP 4X6 (GAUZE/BANDAGES/DRESSINGS) ×3 IMPLANT
DURAPREP 26ML APPLICATOR (WOUND CARE) ×3 IMPLANT
ELECT REM PT RETURN 9FT ADLT (ELECTROSURGICAL) ×3
ELECTRODE REM PT RTRN 9FT ADLT (ELECTROSURGICAL) ×1 IMPLANT
EVACUATOR 1/8 PVC DRAIN (DRAIN) ×3 IMPLANT
GAUZE 4X4 16PLY RFD (DISPOSABLE) IMPLANT
GAUZE SPONGE 4X4 12PLY STRL (GAUZE/BANDAGES/DRESSINGS) ×3 IMPLANT
GAUZE SPONGE 4X4 16PLY XRAY LF (GAUZE/BANDAGES/DRESSINGS) ×3 IMPLANT
GLOVE BIO SURGEON STRL SZ 6.5 (GLOVE) ×2 IMPLANT
GLOVE BIO SURGEON STRL SZ8 (GLOVE) ×3 IMPLANT
GLOVE BIO SURGEONS STRL SZ 6.5 (GLOVE) ×1
GLOVE BIOGEL PI IND STRL 6.5 (GLOVE) ×1 IMPLANT
GLOVE BIOGEL PI IND STRL 7.0 (GLOVE) ×1 IMPLANT
GLOVE BIOGEL PI IND STRL 7.5 (GLOVE) ×2 IMPLANT
GLOVE BIOGEL PI IND STRL 8.5 (GLOVE) ×1 IMPLANT
GLOVE BIOGEL PI INDICATOR 6.5 (GLOVE) ×2
GLOVE BIOGEL PI INDICATOR 7.0 (GLOVE) ×2
GLOVE BIOGEL PI INDICATOR 7.5 (GLOVE) ×4
GLOVE BIOGEL PI INDICATOR 8.5 (GLOVE) ×2
GLOVE ECLIPSE 7.5 STRL STRAW (GLOVE) ×6 IMPLANT
GLOVE ECLIPSE 9.0 STRL (GLOVE) ×6 IMPLANT
GLOVE EXAM NITRILE LRG STRL (GLOVE) IMPLANT
GLOVE EXAM NITRILE XL STR (GLOVE) IMPLANT
GLOVE EXAM NITRILE XS STR PU (GLOVE) IMPLANT
GLOVE SURG SS PI 7.0 STRL IVOR (GLOVE) ×12 IMPLANT
GLOVE SURG SS PI 7.5 STRL IVOR (GLOVE) ×6 IMPLANT
GOWN STRL REUS W/ TWL LRG LVL3 (GOWN DISPOSABLE) ×2 IMPLANT
GOWN STRL REUS W/ TWL XL LVL3 (GOWN DISPOSABLE) ×4 IMPLANT
GOWN STRL REUS W/TWL 2XL LVL3 (GOWN DISPOSABLE) IMPLANT
GOWN STRL REUS W/TWL LRG LVL3 (GOWN DISPOSABLE) ×4
GOWN STRL REUS W/TWL XL LVL3 (GOWN DISPOSABLE) ×8
GRAFT BN 10X1XDBM MAGNIFUSE (Bone Implant) ×3 IMPLANT
GRAFT BONE MAGNIFUSE 1X10CM (Bone Implant) ×6 IMPLANT
KIT BASIN OR (CUSTOM PROCEDURE TRAY) ×3 IMPLANT
KIT INFUSE MEDIUM (Orthopedic Implant) ×3 IMPLANT
KIT TURNOVER KIT B (KITS) ×3 IMPLANT
NEEDLE HYPO 22GX1.5 SAFETY (NEEDLE) ×3 IMPLANT
NS IRRIG 1000ML POUR BTL (IV SOLUTION) ×3 IMPLANT
OIL CARTRIDGE MAESTRO DRILL (MISCELLANEOUS) ×3
PACK LAMINECTOMY NEURO (CUSTOM PROCEDURE TRAY) ×3 IMPLANT
RASP 3.0MM (RASP) ×3 IMPLANT
ROD 5.5MM SPINAL SOLERA (Rod) ×3 IMPLANT
SCREW CONNECTOR SET (Screw) ×24 IMPLANT
SCREW PLIF MA SOLERA 5.5X50 (Screw) ×3 IMPLANT
SCREW SET SOLERA (Screw) ×20 IMPLANT
SCREW SET SOLERA TI5.5 (Screw) ×10 IMPLANT
SCREW SOLERA 45X5.5XMA (Screw) ×1 IMPLANT
SCREW SOLERA 45X5.5XMA NS SPNE (Screw) ×8 IMPLANT
SCREW SOLERA 5.5X45MM (Screw) ×18 IMPLANT
SPONGE SURGIFOAM ABS GEL 100 (HEMOSTASIS) ×3 IMPLANT
STRIP CLOSURE SKIN 1/2X4 (GAUZE/BANDAGES/DRESSINGS) ×2 IMPLANT
SUT VIC AB 0 CT1 18XCR BRD8 (SUTURE) ×2 IMPLANT
SUT VIC AB 0 CT1 8-18 (SUTURE) ×4
SUT VIC AB 2-0 CT1 18 (SUTURE) ×6 IMPLANT
SUT VIC AB 3-0 FS2 27 (SUTURE) ×3 IMPLANT
SUT VIC AB 3-0 SH 8-18 (SUTURE) ×6 IMPLANT
TOWEL GREEN STERILE (TOWEL DISPOSABLE) ×3 IMPLANT
TOWEL GREEN STERILE FF (TOWEL DISPOSABLE) ×3 IMPLANT
TRAY FOLEY MTR SLVR 16FR STAT (SET/KITS/TRAYS/PACK) ×3 IMPLANT
WATER STERILE IRR 1000ML POUR (IV SOLUTION) ×3 IMPLANT

## 2018-02-24 NOTE — Op Note (Signed)
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  Postoperative diagnosis: Same  Procedure Name: Reexploration of lumbar fusion with reduction of patient's kyphotic deformity.  Posterior lateral arthrodesis utilizing segmental pedicle screw instrumentation from T9-L3 with morselized allograft, local autograft, bone morphogenic protein.  Surgeon:Demetra Moya A.Dylan Monforte, M.D.  Asst. Surgeon: Venetia MaxonStern  Anesthesia: General  Indication: 72 year old male status post prior L2 to ilium fusion by another physician.  Patient presents now with severe intractable back pain.  Work-up demonstrates evidence of severe proximal junctional kyphosis at L1-L2 with some loosening of his L2 instrumentation.  Patient presents now for reexploration of his fusion with extension to T9.  Operative note: After induction of anesthesia, patient position prone onto bolsters and appropriately padded.  Thoracic and lumbar region prepped and draped sterilely.  Incision made from T9 down to T4.  Dissection performed bilaterally including dissecting free the previously placed pedicle screws mentation at L2-L3 and L4.  Rods were dissected free at L2-3 and L3-4 and prepared for connector placement.  Intraoperative fluoroscopy was used and pedicle entry sites were then ascertained T9 T10-T11-T12 and L1 bilaterally.  Pilot holes were then drilled and probed using a pedicle awl.  Each pedicle all track was then probed and found to be solidly within the bone.  Each pedicle all track was then tapped and once again probed to ensure good position.  5.5 mm Solera brand screws from [employer] were then placed bilaterally from T9-L1.  Images reveal good position of the screws in both the AP and lateral planes with normal alignment of the spine.  A short segment of titanium rod was then contoured and cut and then  placed over the screw heads from T9-L1 with addition of 2 side to side connectors on each side one between the L2-3 screws and one between the L3-4 screws.  Locking caps were then engaged.  Locking caps were given final tightening.  Final alignment was ensured to be good in both the AP and lateral planes using fluoroscopy.  Bone was then decorticated using high-speed drill.  Morselized allograft coupled with locally harvested autograft was packed along the lamina from T9-L1 and along the transverse processes from L1-L3.  Bone morphogenic-soaked sponges were also used.  Vancomycin powder was then placed the deep wound space.  A medium Hemovac drain was placed in the deep wound space.  Wounds and closed in layers with Vicryl sutures.  Steri-Strips and sterile dressing were applied.  No apparent complications.  Patient tolerated the procedure well and she returns to the recovery room postop.

## 2018-02-24 NOTE — Transfer of Care (Signed)
Immediate Anesthesia Transfer of Care Note  Patient: Ryan Stone  Procedure(s) Performed: Posterior Lateral Fusion Thoracic nine - Lumbar four (N/A Back)  Patient Location: PACU  Anesthesia Type:General  Level of Consciousness: awake, alert  and oriented  Airway & Oxygen Therapy: Patient Spontanous Breathing and Patient connected to nasal cannula oxygen  Post-op Assessment: Report given to RN, Post -op Vital signs reviewed and stable and Patient moving all extremities X 4  Post vital signs: Reviewed and stable  Last Vitals:  Vitals Value Taken Time  BP 147/73 02/24/2018  4:05 PM  Temp    Pulse 65 02/24/2018  4:10 PM  Resp 16 02/24/2018  4:10 PM  SpO2 93 % 02/24/2018  4:10 PM  Vitals shown include unvalidated device data.  Last Pain:  Vitals:   02/24/18 0827  TempSrc: Oral         Complications: No apparent anesthesia complications

## 2018-02-24 NOTE — Anesthesia Procedure Notes (Signed)
Procedure Name: Intubation Date/Time: 02/24/2018 11:36 AM Performed by: Mariea Clonts, CRNA Pre-anesthesia Checklist: Patient identified, Emergency Drugs available, Suction available and Patient being monitored Patient Re-evaluated:Patient Re-evaluated prior to induction Oxygen Delivery Method: Circle System Utilized Preoxygenation: Pre-oxygenation with 100% oxygen Induction Type: IV induction Ventilation: Mask ventilation without difficulty Laryngoscope Size: Mac and 4 Grade View: Grade I Tube type: Oral Tube size: 8.0 mm Number of attempts: 1 Airway Equipment and Method: Stylet and Oral airway Placement Confirmation: ETT inserted through vocal cords under direct vision,  positive ETCO2 and breath sounds checked- equal and bilateral Tube secured with: Tape Dental Injury: Teeth and Oropharynx as per pre-operative assessment

## 2018-02-24 NOTE — H&P (Signed)
Ryan Stone is an 72 y.o. male.   Chief Complaint: Back pain HPI: 72 year old male status post prior L2 to pelvis fusion with instrumentation done by another physician approximately 1 year ago.  Patient with severe ongoing back pain and severe postural kyphosis.  Work-up demonstrates evidence of severe proximal junctional kyphosis with also some tear out of his L2 pedicle screws into the disc space at L1-2.  Patient has failed conservative management.  He presents now for revision/extension of his fusion up to T9.  Past Medical History:  Diagnosis Date  . Arthritis   . Chronic kidney disease    AKI  . DDD (degenerative disc disease), lumbar   . Dyspnea    with exertion  . Dysrhythmia    Afib/ Afullter  . Family history of adverse reaction to anesthesia    sister- N/V  . Gout   . History of blood transfusion   . Hypertension   . Sleep apnea    does not use CPAP  . TIA (transient ischemic attack)     Past Surgical History:  Procedure Laterality Date  . ABLATION    . BACK SURGERY      x 2 Fusion x 1  . CARDIAC CATHETERIZATION    . CHOLECYSTECTOMY    . COLONOSCOPY    . EYE SURGERY Bilateral    Cataract  . JOINT REPLACEMENT Left 2018   Knee  . LEG SURGERY Left    Rod removed  . TIBIA FRACTURE SURGERY Left    rod placed    History reviewed. No pertinent family history. Social History:  reports that he quit smoking about 34 years ago. He quit after 19.00 years of use. He has never used smokeless tobacco. He reports that he drank alcohol. He reports that he has current or past drug history.  Allergies:  Allergies  Allergen Reactions  . Amlodipine Nausea Only and Swelling    Leg swell     Medications Prior to Admission  Medication Sig Dispense Refill  . allopurinol (ZYLOPRIM) 300 MG tablet Take 300 mg by mouth daily.  0  . amiodarone (PACERONE) 200 MG tablet Take 200 mg by mouth daily.  2  . atorvastatin (LIPITOR) 40 MG tablet Take 40 mg by mouth at bedtime.  3  .  BESIVANCE 0.6 % SUSP Place 1 drop into both eyes 3 (three) times daily.  1  . carvedilol (COREG) 25 MG tablet Take 25 mg by mouth 2 (two) times daily.  3  . cloNIDine (CATAPRES - DOSED IN MG/24 HR) 0.2 mg/24hr patch Place 0.2 mg onto the skin every Monday.  1  . DUREZOL 0.05 % EMUL Place 1 drop into both eyes 3 (three) times daily.  1  . dutasteride (AVODART) 0.5 MG capsule Take 0.5 mg by mouth daily.  1  . gabapentin (NEURONTIN) 100 MG capsule Take 100 mg by mouth 3 (three) times daily.  2  . hydrALAZINE (APRESOLINE) 100 MG tablet Take 100 mg by mouth daily.  2  . isosorbide mononitrate (IMDUR) 120 MG 24 hr tablet Take 120 mg by mouth daily.  3  . levothyroxine (SYNTHROID, LEVOTHROID) 100 MCG tablet Take 100 mcg by mouth daily before breakfast.  0  . losartan (COZAAR) 100 MG tablet Take 100 mg by mouth daily.  5  . PROLENSA 0.07 % SOLN Place 1 drop into both eyes at bedtime.  1  . tamsulosin (FLOMAX) 0.4 MG CAPS capsule Take 0.4 mg by mouth at bedtime.  1  No results found for this or any previous visit (from the past 48 hour(s)). No results found.  Pertinent items noted in HPI and remainder of comprehensive ROS otherwise negative.  Blood pressure (!) 164/74, pulse 66, temperature 98.5 F (36.9 C), temperature source Oral, resp. rate 18, height 5\' 11"  (1.803 m), weight 102.1 kg, SpO2 95 %.  Patient is awake and alert.  He is oriented and appropriate.  He is obviously in a great deal discomfort.  Examination of his head ears eyes nose throat is unremarkable her chest and abdomen are benign.  Extremities are free from injury deformity.  Neurologically he is awake and alert.  Oriented and appropriate.  Speech is fluent.  Judgment and insight are intact.  Cranial nerve function normal bilateral.  Motor examination with some mild weakness in his right anterior quadriceps muscle group otherwise motor strength intact.  Sensory examination with some decreased sensation in his right L4 and L5  dermatomes.  Deep tender mixes normal active.  No evidence of long track signs.  Gait is severely antalgic.  Posture is flexed. Assessment/Plan L1-L2 proximal junctional kyphosis with severe back pain and postural kyphosis.  Plan for reexploration of his lumbar fusion with extension of lumbar fusion up to the T9 level using segmental pedicle screw fixation.  Bone morphogenic protein, allograft and local bone will also be used for fusion.  Risks and benefits been explained.  Patient wishes to proceed.  Sherilyn CooterHenry A Quency Tober 02/24/2018, 10:44 AM

## 2018-02-24 NOTE — Brief Op Note (Signed)
02/24/2018  3:48 PM  PATIENT:  Orvilla Cornwallon A Slagel  72 y.o. male  PRE-OPERATIVE DIAGNOSIS:  Kyphosis  POST-OPERATIVE DIAGNOSIS:  Kyphosis  PROCEDURE:  Procedure(s): Posterior Lateral Fusion Thoracic nine - Lumbar four (N/A)  SURGEON:  Surgeon(s) and Role:    * Julio SicksPool, Sachiko Methot, MD - Primary    * Maeola HarmanStern, Joseph, MD - Assisting  PHYSICIAN ASSISTANT:   ASSISTANTS:    ANESTHESIA:   general  EBL:  250 mL   BLOOD ADMINISTERED:none  DRAINS: (med) Hemovact drain(s) in the deep wound space with  Suction Open   LOCAL MEDICATIONS USED:  MARCAINE     SPECIMEN:  No Specimen  DISPOSITION OF SPECIMEN:  N/A  COUNTS:  YES  TOURNIQUET:  * No tourniquets in log *  DICTATION: .Dragon Dictation  PLAN OF CARE: Admit to inpatient   PATIENT DISPOSITION:  PACU - hemodynamically stable.   Delay start of Pharmacological VTE agent (>24hrs) due to surgical blood loss or risk of bleeding: yes

## 2018-02-25 NOTE — Progress Notes (Addendum)
PT Cancellation Note  Patient Details Name: Ryan Stone MRN: [id number] DOB: May 28, 1946   Cancelled Treatment:    Reason Eval/Treat Not Completed: Other (comment)(await back brace) Ortho tech called   Coltyn Hanning B Dashel Goines 02/25/2018, 9:04 AM Delaney MeigsMaija Tabor Wadie Liew, PT Acute Rehabilitation Services Pager: [phone] Office: [phone]

## 2018-02-25 NOTE — Evaluation (Addendum)
Physical Therapy Evaluation Patient Details Name: Ryan Stone MRN: [id number] DOB: Oct 24, 1945 Today's Date: 02/25/2018   History of Present Illness  72 yo male admitted 9/20 for revision/extension posterior Lateral Fusion T9 - L3. Previous L2 to pelvis fusion. Previous L TKA 2018. Personal medical hx: HTN, Afib, TIA, CKD, gout.  Clinical Impression  Pt pleasant on arrival, reporting feeling better than yesterday. Pt with mod assist +2 for bed mobility and transfers, min +2 for chair follow for ambulation. Pt overall shaky for 15 ft with pain in bil knees limiting ambulation, with pt denying further distance. Lumbar precautions reviewed with handout provided, decreased carryover throughout session. Pt with decreased awareness of safety and deficits, with max verbal and tactile cues for bed mobility. Pt with decreased strength, coordination, balance, and mobility (see Pt problem list below for all). Pt will benefit acutely from skilled therapy to increase functional mobility, independence and to decrease fall risk.       Follow Up Recommendations SNF;Supervision/Assistance - 24 hour    Equipment Recommendations  None recommended by PT    Recommendations for Other Services OT consult     Precautions / Restrictions Precautions Precautions: Fall;Back Precaution Booklet Issued: Yes (comment) Precaution Comments: brace not present and spoke with Dr.Pool who cleared mobility prior to brace arrival, ortho tech called for brace Required Braces or Orthoses: Spinal Brace Spinal Brace: Applied in sitting position Restrictions Weight Bearing Restrictions: No      Mobility  Bed Mobility Overal bed mobility: Needs Assistance Bed Mobility: Rolling;Sidelying to Sit Rolling: Mod assist Sidelying to sit: Min assist       General bed mobility comments: max verbal and tactile cues for log roll technique, assist to roll trunk, bring legs off EOB and elevate trunk. bed mobility for elevated HOB 35  degrees, reports has hospital bed at home.   Transfers Overall transfer level: Needs assistance Equipment used: Rolling walker (2 wheeled) Transfers: Sit to/from Stand Sit to Stand: Mod assist;+2 physical assistance;From elevated surface         General transfer comment: two sit/stand trials, first min +2 to power up, second attempt mod +2. cues for hand placement on bed to assist with stand.   Ambulation/Gait Ambulation/Gait assistance: Min assist;+2 physical assistance(+2 for chair follow. ) Gait Distance (Feet): 15 Feet Assistive device: Rolling walker (2 wheeled) Gait Pattern/deviations: Step-through pattern;Decreased stride length;Trunk flexed;Narrow base of support(crouched gait ) Gait velocity: decreased  Gait velocity interpretation: <1.8 ft/sec, indicate of risk for recurrent falls General Gait Details: min a +2 for chair follow. pt unsteady with crouched gait, unable to achieve knee extension with vcs, cues for upright posture and stepping inside walker. pt request to sit down due to fatigue and pain in knees.   Stairs            Wheelchair Mobility    Modified Rankin (Stroke Patients Only)       Balance Overall balance assessment: Needs assistance Sitting-balance support: Feet supported;Bilateral upper extremity supported Sitting balance-Leahy Scale: Fair     Standing balance support: Bilateral upper extremity supported;During functional activity Standing balance-Leahy Scale: Poor                               Pertinent Vitals/Pain Pain Assessment: 0-10 Pain Score: 8  Pain Location: back, bil knees. pain 3/4 at rest, 8 with activity.  Pain Descriptors / Indicators: Sore;Grimacing;Guarding;Discomfort;Aching Pain Intervention(s): Limited activity within patient's tolerance;Monitored during session;Repositioned  Home Living Family/patient expects to be discharged to:: Private residence Living Arrangements: Alone Available Help at  Discharge: Family;Friend(s)(son and sister can assist) Type of Home: House Home Access: Stairs to enter;Ramped entrance   Entrance Stairs-Number of Steps: 3 Home Layout: One level Home Equipment: Grab bars - tub/shower;Grab bars - toilet;Tub bench      Prior Function Level of Independence: Independent         Comments: drives, 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 fishing.      Hand Dominance        Extremity/Trunk Assessment   Upper Extremity Assessment Upper Extremity Assessment: Generalized weakness;Defer to OT evaluation    Lower Extremity Assessment Lower Extremity Assessment: Generalized weakness;RLE deficits/detail;LLE deficits/detail RLE Deficits / Details: previous TKA, reports pain, trouble achieving extension with gait  LLE Deficits / Details: previous TKA, reports pain, trouble achieving extension with gait     Cervical / Trunk Assessment Cervical / Trunk Assessment: Kyphotic  Communication      Cognition Arousal/Alertness: Awake/alert Behavior During Therapy: Flat affect Overall Cognitive Status: Impaired/Different from baseline Area of Impairment: Problem solving;Following commands                       Following Commands: Follows one step commands with increased time;Follows one step commands inconsistently     Problem Solving: Slow processing;Requires verbal cues;Requires tactile cues        General Comments General comments (skin integrity, edema, etc.): tear in lumbar bandage, nurse aware of dressing change need.     Exercises     Assessment/Plan    PT Assessment Patient needs continued PT services  PT Problem List Decreased strength;Decreased mobility;Decreased range of motion;Decreased activity tolerance;Decreased balance;Decreased knowledge of use of DME;Pain       PT Treatment Interventions DME instruction;Functional mobility training;Balance training;Patient/family education;Gait training;Therapeutic activities;Stair  training;Therapeutic exercise    PT Goals (Current goals can be found in the Care Plan section)  Acute Rehab PT Goals Patient Stated Goal: return home  PT Goal Formulation: With patient Time For Goal Achievement: 03/10/18 Potential to Achieve Goals: Fair    Frequency Min 5X/week   Barriers to discharge        Co-evaluation               AM-PAC PT "6 Clicks" Daily Activity  Outcome Measure Difficulty turning over in bed (including adjusting bedclothes, sheets and blankets)?: Unable Difficulty moving from lying on back to sitting on the side of the bed? : Unable Difficulty sitting down on and standing up from a chair with arms (e.g., wheelchair, bedside commode, etc,.)?: Unable Help needed moving to and from a bed to chair (including a wheelchair)?: A Lot Help needed walking in hospital room?: A Lot Help needed climbing 3-5 steps with a railing? : Total 6 Click Score: 8    End of Session Equipment Utilized During Treatment: Gait belt Activity Tolerance: Patient tolerated treatment well Patient left: in chair;with call bell/phone within reach Nurse Communication: Mobility status;Precautions PT Visit Diagnosis: Other abnormalities of gait and mobility (R26.89);Muscle weakness (generalized) (M62.81)    Time: 1610-9604 PT Time Calculation (min) (ACUTE ONLY): 29 min   Charges:   PT Evaluation $PT Eval Moderate Complexity: 1 Mod PT Treatments $Therapeutic Activity: 8-22 mins        Carma Lair, [place]  Acute Rehab [phone]   Carma Lair 02/25/2018, 12:02 PM

## 2018-02-25 NOTE — Evaluation (Signed)
Occupational Therapy Evaluation Patient Details Name: Ryan Stone A Nicolaou MRN: [id number] DOB: [birth year] Today's Date: 02/25/2018    History of Present Illness 72 yo male admitted 9/20 for revision/extension posterior Lateral Fusion T9 - L3. Previous L2 to pelvis fusion. Previous L TKA 2018. Personal medical hx: HTN, Afib, TIA, CKD, gout.   Clinical Impression   This 72 y/o male presents with the above. At baseline pt is independent with ADLs, iADLs and functional mobility, lives alone. Pt presenting with generalized weakness, pain, decreased dynamic balance. Pt requiring modA+2 for stand pivot transfers this session using RW with cues for sequencing. Currently requires minA for UB ADL, mod-maxA for LB ADLs. Pt will benefit from continued acute OT services and recommend follow up therapy services in SNF setting after discharge to maximize his overall safety and independence with ADLs and mobility prior to returh home. Will follow.     Follow Up Recommendations  SNF;Supervision/Assistance - 24 hour    Equipment Recommendations  3 in 1 bedside commode;Other (comment)(TBD in next venue )           Precautions / Restrictions Precautions Precautions: Fall;Back Precaution Booklet Issued: Yes (comment)(issued by PT, verbally reviewed back precautions ) Precaution Comments: brace not present and spoke with Dr.Pool who cleared mobility prior to brace arrival, ortho tech called for brace Required Braces or Orthoses: Spinal Brace Spinal Brace: Applied in sitting position Restrictions Weight Bearing Restrictions: No      Mobility Bed Mobility Overal bed mobility: Needs Assistance Bed Mobility: Sit to Sidelying;Rolling Rolling: Mod assist Sidelying to sit: Min assist     Sit to sidelying: Mod assist General bed mobility comments: max verbal and tactile cues for log roll technique, pt with increased difficulty understanding instructions for returning to sidelying; requires assist for LEs onto  EOB; multimodal cues and assist for rolling during repositioning   Transfers Overall transfer level: Needs assistance Equipment used: Rolling walker (2 wheeled) Transfers: Stand Pivot Transfers Sit to Stand: Mod assist;+2 physical assistance;From elevated surface Stand pivot transfers: Min assist;Mod assist;+2 physical assistance;+2 safety/equipment       General transfer comment: pt standing with NT upon entering room; assisted with transfer back to EOB, pt requiring multimodal cues for safety, initially with minA+2 though progressed to modA+2 as pt attempting to sit on EOB prior to fully turning body towards edge    Balance Overall balance assessment: Needs assistance Sitting-balance support: Feet supported;Bilateral upper extremity supported Sitting balance-Leahy Scale: Fair     Standing balance support: Bilateral upper extremity supported;During functional activity Standing balance-Leahy Scale: Poor                             ADL either performed or assessed with clinical judgement   ADL Overall ADL's : Needs assistance/impaired Eating/Feeding: Independent;Sitting   Grooming: Set up;Sitting   Upper Body Bathing: Min guard;Sitting   Lower Body Bathing: Moderate assistance;+2 for safety/equipment;+2 for physical assistance;Sit to/from stand   Upper Body Dressing : Minimal assistance;Sitting   Lower Body Dressing: Maximal assistance;+2 for physical assistance;+2 for safety/equipment;Sit to/from stand Lower Body Dressing Details (indicate cue type and reason): began education regarding use of AE to perform LB ADLs, pt reports he has reahcer at home. Will benefit from further review and practice of AE  Toilet Transfer: Moderate assistance;+2 for safety/equipment;+2 for physical assistance;Stand-pivot;BSC;RW Toilet Transfer Details (indicate cue type and reason): simulated in transfer recliner>EOB Toileting- Clothing Manipulation and Hygiene: Maximal assistance;+2  for physical  assistance;+2 for safety/equipment;Sit to/from stand       Functional mobility during ADLs: Moderate assistance;+2 for physical assistance;+2 for safety/equipment;Rolling walker General ADL Comments: pt standing with NT upon entering room preparing to transfer back to bed; assisted with stand pivot transfer as pt requiring increased physical assist for transfers/ADL tasks due to pain and generalized weakness                          Pertinent Vitals/Pain Pain Assessment: Faces Pain Score: 8  Faces Pain Scale: Hurts even more Pain Location: back Pain Descriptors / Indicators: Sore;Grimacing;Guarding;Discomfort;Aching Pain Intervention(s): Limited activity within patient's tolerance;Monitored during session;Repositioned     Hand Dominance     Extremity/Trunk Assessment Upper Extremity Assessment Upper Extremity Assessment: Generalized weakness   Lower Extremity Assessment Lower Extremity Assessment: Defer to PT evaluation RLE Deficits / Details: previous TKA, reports pain, trouble achieving extension with gait  LLE Deficits / Details: previous TKA, reports pain, trouble achieving extension with gait    Cervical / Trunk Assessment Cervical / Trunk Assessment: Kyphotic   Communication Communication Communication: No difficulties   Cognition Arousal/Alertness: Awake/alert Behavior During Therapy: Flat affect Overall Cognitive Status: Impaired/Different from baseline Area of Impairment: Problem solving;Following commands                       Following Commands: Follows one step commands with increased time     Problem Solving: Slow processing;Requires verbal cues;Requires tactile cues General Comments: pt requiring multimodal cues to safely follow instructions during mobility tasks   General Comments  tear in lumbar bandage, nurse aware of dressing change need.     Exercises     Shoulder Instructions      Home Living Family/patient  expects to be discharged to:: Private residence Living Arrangements: Alone Available Help at Discharge: Family;Friend(s)(son and sister can assist) Type of Home: House Home Access: Stairs to enter;Ramped entrance Entrance Stairs-Number of Steps: 3   Home Layout: One level     Bathroom Shower/Tub: Chief Strategy Officer: Handicapped height Bathroom Accessibility: Yes   Home Equipment: Grab bars - tub/shower;Grab bars - toilet;Shower seat          Prior Functioning/Environment Level of Independence: Independent        Comments: drives, 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 fishing.         OT Problem List: Decreased strength;Decreased activity tolerance;Decreased knowledge of use of DME or AE;Decreased knowledge of precautions;Pain;Impaired balance (sitting and/or standing)      OT Treatment/Interventions: Self-care/ADL training;DME and/or AE instruction;Therapeutic activities;Balance training;Therapeutic exercise;Patient/family education    OT Goals(Current goals can be found in the care plan section) Acute Rehab OT Goals Patient Stated Goal: return home  OT Goal Formulation: With patient Time For Goal Achievement: 03/11/18 Potential to Achieve Goals: Good  OT Frequency: Min 2X/week   Barriers to D/C:            Co-evaluation              AM-PAC PT "6 Clicks" Daily Activity     Outcome Measure Help from another person eating meals?: None Help from another person taking care of personal grooming?: A Little Help from another person toileting, which includes using toliet, bedpan, or urinal?: A Lot Help from another person bathing (including washing, rinsing, drying)?: A Lot Help from another person to put on and taking off regular upper body clothing?: A Little Help from another  person to put on and taking off regular lower body clothing?: A Lot 6 Click Score: 16   End of Session Equipment Utilized During Treatment: Engineer, water  Communication: Mobility status  Activity Tolerance: Patient tolerated treatment well Patient left: in bed;with call bell/phone within reach;with bed alarm set  OT Visit Diagnosis: Other abnormalities of gait and mobility (R26.89);Unsteadiness on feet (R26.81)                Time: 1610-9604 OT Time Calculation (min): 17 min Charges:  OT General Charges $OT Visit: 1 Visit OT Evaluation $OT Eval Moderate Complexity: 1 Mod  Marcy Siren, OT [employer] Pager [phone] Office [phone]   Orlando Penner 02/25/2018, 1:24 PM

## 2018-02-25 NOTE — Progress Notes (Signed)
Postop day 1.  Pain very much improved from preop.  Patient has not been out of bed yet.  No new lower extremity symptoms.  Afebrile.  Vital signs are stable.  Patient is awake and alert.  He is oriented and appropriate.  Motor and sensory function intact in both lower extremities.  Wound clean and dry.  Drain output moderate.  Overall doing well following extension of his lumbar fusion up to T9.  Begin mobilization.  Possible discharge Sunday or Monday.

## 2018-02-25 NOTE — Progress Notes (Signed)
Orthopedic Tech Progress Note Patient Details:  Ryan FriendlyDon A Stone 1945-06-15 [id number]  Patient ID: Ryan Stone, male   DOB: 1945-06-15, 72 y.o.   MRN: [id number]   Ryan FordyceJennifer C Mamoudou Stone 02/25/2018, 10:17 AMCalled [employer] for Lumbar brace.

## 2018-02-26 NOTE — Progress Notes (Signed)
Pt son called to update [facility]

## 2018-02-26 NOTE — Progress Notes (Signed)
Patient placed on CPAP 10 cmH2O via [facility] .  Patient is tolerating at this time.    02/26/18 2112  BiPAP/CPAP/SIPAP  BiPAP/CPAP/SIPAP Pt Type Adult  Mask Type Full face mask  Mask Size Large  Respiratory Rate 18 breaths/min  EPAP 10 cmH2O  Oxygen Percent 21 %  BiPAP/CPAP/SIPAP CPAP  Patient Home Equipment No  Auto Titrate No  BiPAP/CPAP /SiPAP Vitals  Pulse Rate 73  Resp 18  SpO2 98 %  Bilateral Breath Sounds Clear;Diminished

## 2018-02-26 NOTE — Progress Notes (Signed)
Physical Therapy Treatment Patient Details Name: Ryan Stone MRN: [id number] DOB: 1946/05/09 Today's Date: 02/26/2018    History of Present Illness 72 yo male admitted 9/20 for revision/extension posterior Lateral Fusion T9 - L3. Previous L2 to pelvis fusion. Previous L TKA 2018. Personal medical hx: HTN, Afib, TIA, CKD, gout.    PT Comments    Pt in chair on arrival with brace. He was able to state "BLT" but could only recall bending. Mod assist to Ronel brace and adjust in sitting. Pt with improved transfers today but continues to fatigue quickly with need for 2 person assist and close chair follow with gait. Pt educated for all precautions and restrictions with teach back with limited carry over despite education.     Follow Up Recommendations  SNF;Supervision/Assistance - 24 hour     Equipment Recommendations  None recommended by PT    Recommendations for Other Services       Precautions / Restrictions Precautions Precautions: Fall;Back Precaution Comments: pt able to state no bending, reviewed all precautions Required Braces or Orthoses: Spinal Brace Spinal Brace: Applied in sitting position    Mobility  Bed Mobility               General bed mobility comments: in chair on arrival  Transfers Overall transfer level: Needs assistance   Transfers: Sit to/from Stand Sit to Stand: Min assist;+2 physical assistance         General transfer comment: cues for hand placement, assist to rise from chair x 2 trials  Ambulation/Gait Ambulation/Gait assistance: Min assist;+2 safety/equipment Gait Distance (Feet): 18 Feet Assistive device: Rolling walker (2 wheeled) Gait Pattern/deviations: Step-through pattern;Decreased stride length;Trunk flexed;Narrow base of support   Gait velocity interpretation: <1.31 ft/sec, indicative of household ambulator General Gait Details: min a +2 for chair follow. pt unsteady with crouched gait, unable to achieve knee extension with  vcs, cues for upright posture and stepping inside walker. very close chair follow as pt fatigues and cannot anticipate activity tolerance. Pt performed 2 trials of 18' gait with seated rest between   Stairs             Wheelchair Mobility    Modified Rankin (Stroke Patients Only)       Balance Overall balance assessment: Needs assistance   Sitting balance-Leahy Scale: Fair     Standing balance support: Bilateral upper extremity supported;During functional activity Standing balance-Leahy Scale: Poor                              Cognition Arousal/Alertness: Awake/alert Behavior During Therapy: Flat affect Overall Cognitive Status: Impaired/Different from baseline Area of Impairment: Memory;Problem solving                     Memory: Decreased recall of precautions Following Commands: Follows one step commands with increased time     Problem Solving: Slow processing;Requires verbal cues;Requires tactile cues        Exercises General Exercises - Lower Extremity Long Arc Quad: AROM;10 reps;Seated;Both Hip Flexion/Marching: AROM;10 reps;Seated;Both    General Comments        Pertinent Vitals/Pain Pain Score: 2  Pain Location: back Pain Descriptors / Indicators: Sore;Discomfort Pain Intervention(s): Limited activity within patient's tolerance;Repositioned;Monitored during session    Home Living                      Prior Function  PT Goals (current goals can now be found in the care plan section) Progress towards PT goals: Progressing toward goals    Frequency           PT Plan Current plan remains appropriate    Co-evaluation              AM-PAC PT "6 Clicks" Daily Activity  Outcome Measure  Difficulty turning over in bed (including adjusting bedclothes, sheets and blankets)?: Unable Difficulty moving from lying on back to sitting on the side of the bed? : Unable Difficulty sitting down on and  standing up from a chair with arms (e.g., wheelchair, bedside commode, etc,.)?: Unable Help needed moving to and from a bed to chair (including a wheelchair)?: A Lot Help needed walking in hospital room?: A Little Help needed climbing 3-5 steps with a railing? : Total 6 Click Score: 9    End of Session Equipment Utilized During Treatment: Gait belt;Back brace Activity Tolerance: Patient tolerated treatment well Patient left: in chair;with call bell/phone within reach Nurse Communication: Mobility status;Precautions PT Visit Diagnosis: Other abnormalities of gait and mobility (R26.89);Muscle weakness (generalized) (M62.81)     Time: 1610-96040804-0828 PT Time Calculation (min) (ACUTE ONLY): 24 min  Charges:  $Gait Training: 8-22 mins $Therapeutic Activity: 8-22 mins                     Sarita Hakanson Abner Greenspanabor Tyson Parkison, PT Acute Rehabilitation Services Pager: [phone] Office: [phone]    Enedina FinnerMaija B Ryonna Cimini 02/26/2018, 8:38 AM

## 2018-02-26 NOTE — Progress Notes (Signed)
Subjective: Patient reports pain is still much improved  Objective: Vital signs in last 24 hours: Temp:  [97.9 F (36.6 C)-98.4 F (36.9 C)] 98.4 F (36.9 C) (09/22 1110) Pulse Rate:  [64-73] 73 (09/22 0746) Resp:  [18-20] 18 (09/22 0746) BP: (111-161)/(58-86) 156/78 (09/22 0746) SpO2:  [95 %-100 %] 98 % (09/22 0805)  Intake/Output from previous day: 09/21 0701 - 09/22 0700 In: 560 [P.O.:360; IV Piggyback:100] Out: 1005 [Urine:600; Drains:405] Intake/Output this shift: Total I/O In: -  Out: 900 [Urine:800; Drains:100]  Physical Exam: Strength full in both legs.  Dressing CDI.  Drain still putting a fair amount out.    Lab Results: No results for input(s): WBC, HGB, HCT, PLT in the last 72 hours. BMET No results for input(s): NA, K, CL, CO2, GLUCOSE, BUN, CREATININE, CALCIUM in the last 72 hours.  Studies/Results: Dg Thoracolumabar Spine  Addendum Date: 02/24/2018   ADDENDUM REPORT: 02/24/2018 15:46 ADDENDUM: Four additional intraoperative views were obtained in addition to the original 3 spot images. The additional images again demonstrate multiple pedicle screws in the thoracolumbar spine and placement of posterior stabilization rods and interbody device. Electronically Signed   By: Jasmine PangKim  Fujinaga M.D.   On: 02/24/2018 15:46   Result Date: 02/24/2018 CLINICAL DATA:  Posterolateral thoracic fusion EXAM: THORACOLUMBAR SPINE 1V COMPARISON:  None. FINDINGS: Six low resolution intraoperative spot views of the thoracolumbar spine. Total fluoroscopy time was 58 seconds. Images demonstrate placement of multiple pedicle screws and partially visible posterior rods at the thoracolumbar spine. IMPRESSION: Intraoperative fluoroscopic assistance provided during spine surgery. Electronically Signed: By: Jasmine PangKim  Fujinaga M.D. On: 02/24/2018 14:36   Dg Thoracolumabar Spine  Result Date: 02/24/2018 CLINICAL DATA:  Spine surgery EXAM: THORACOLUMBAR SPINE 1V COMPARISON:  None. FINDINGS: Seven  intraoperative spot views of the thoracolumbar spine. Total fluoroscopy time was 63 seconds. The images demonstrate placement of multiple pedicle screws within the thoracolumbar spine with placement of posterior stabilization rods and interbody device. Surgical retractor devices noted. IMPRESSION: Intraoperative fluoroscopic assistance provided during spine surgery. Electronically Signed   By: Jasmine PangKim  Fujinaga M.D.   On: 02/24/2018 15:45   Dg C-arm 1-60 Min  Addendum Date: 02/24/2018   ADDENDUM REPORT: 02/24/2018 15:46 ADDENDUM: Four additional intraoperative views were obtained in addition to the original 3 spot images. The additional images again demonstrate multiple pedicle screws in the thoracolumbar spine and placement of posterior stabilization rods and interbody device. Electronically Signed   By: Jasmine PangKim  Fujinaga M.D.   On: 02/24/2018 15:46   Result Date: 02/24/2018 CLINICAL DATA:  Posterolateral thoracic fusion EXAM: THORACOLUMBAR SPINE 1V COMPARISON:  None. FINDINGS: Six low resolution intraoperative spot views of the thoracolumbar spine. Total fluoroscopy time was 58 seconds. Images demonstrate placement of multiple pedicle screws and partially visible posterior rods at the thoracolumbar spine. IMPRESSION: Intraoperative fluoroscopic assistance provided during spine surgery. Electronically Signed: By: Jasmine PangKim  Fujinaga M.D. On: 02/24/2018 14:36    Assessment/Plan: Continue to mobilize.  Doing well.  Patient feels he will do better with Rehab than going directly home.  [facility]  He feels that he is doing well, but says "I'm slow."    LOS: 2 days    Ryan Stone D, MD 02/26/2018, 11:30 AM

## 2018-02-27 LAB — CBC WITH DIFFERENTIAL/PLATELET
Abs Immature Granulocytes: 0.1 10*3/uL (ref 0.0–0.1)
Basophils Absolute: 0 10*3/uL (ref 0.0–0.1)
Basophils Relative: 1 %
EOS ABS: 0.1 10*3/uL (ref 0.0–0.7)
Eosinophils Relative: 2 %
HCT: 25.8 % — ABNORMAL LOW (ref 39.0–52.0)
Hemoglobin: 8.5 g/dL — ABNORMAL LOW (ref 13.0–17.0)
Immature Granulocytes: 1 %
Lymphocytes Relative: 11 %
Lymphs Abs: 0.7 10*3/uL (ref 0.7–4.0)
MCH: 32.3 pg (ref 26.0–34.0)
MCHC: 32.9 g/dL (ref 30.0–36.0)
MCV: 98.1 fL (ref 78.0–100.0)
MONO ABS: 0.8 10*3/uL (ref 0.1–1.0)
MONOS PCT: 12 %
Neutro Abs: 4.6 10*3/uL (ref 1.7–7.7)
Neutrophils Relative %: 73 %
Platelets: 166 10*3/uL (ref 150–400)
RBC: 2.63 MIL/uL — ABNORMAL LOW (ref 4.22–5.81)
RDW: 14.6 % (ref 11.5–15.5)
WBC: 6.3 10*3/uL (ref 4.0–10.5)

## 2018-02-27 LAB — BASIC METABOLIC PANEL
Anion gap: 9 (ref 5–15)
BUN: 27 mg/dL — ABNORMAL HIGH (ref 8–23)
CALCIUM: 8.8 mg/dL — AB (ref 8.9–10.3)
CO2: 27 mmol/L (ref 22–32)
Chloride: 96 mmol/L — ABNORMAL LOW (ref 98–111)
Creatinine, Ser: 1.06 mg/dL (ref 0.61–1.24)
GFR calc Af Amer: >60 mL/min (ref >60)
GLUCOSE: 121 mg/dL — AB (ref 70–99)
POTASSIUM: 3.4 mmol/L — AB (ref 3.5–5.1)
Sodium: 132 mmol/L — ABNORMAL LOW (ref 135–145)

## 2018-02-27 LAB — URINALYSIS, ROUTINE W REFLEX MICROSCOPIC
Bilirubin Urine: NEGATIVE
GLUCOSE, UA: NEGATIVE mg/dL
Hgb urine dipstick: NEGATIVE
KETONES UR: NEGATIVE mg/dL
LEUKOCYTES UA: NEGATIVE
Nitrite: NEGATIVE
PROTEIN: NEGATIVE mg/dL
Specific Gravity, Urine: 1.014 (ref 1.005–1.030)
pH: 5 (ref 5.0–8.0)

## 2018-02-27 MED FILL — Thrombin (Recombinant) For Soln 20000 Unit: CUTANEOUS | Qty: 1 | Status: AC

## 2018-02-27 NOTE — Social Work (Signed)
CSW left packet with SNF offers at bedside for placement. Have also left a message at [facility] to see if they [facility], since [place].   Doy HutchingIsabel H Kailah Pennel, LCSWA [facility] Clinical Social Work [phone]

## 2018-02-27 NOTE — Clinical Social Work Note (Signed)
Clinical Social Work Assessment  Patient Details  Name: Ryan Stone MRN: [id number] Date of Birth: February 22, 1946  Date of referral:  02/27/18               Reason for consult:  Discharge Planning                Permission sought to share information with:  Family Supports, Magazine features editoracility Contact Representative Permission granted to share information::  Yes, Verbal Permission Granted  Name::     Radene KneeDonnie Wittner  Agency::  [facility]  Relationship::  son  Contact Information:  [phone]  Housing/Transportation Living arrangements for the past 2 months:  Single Family Home Source of Information:  Adult Children Patient Interpreter Needed:  None Criminal Activity/Legal Involvement Pertinent to Current Situation/Hospitalization:  No - Comment as needed Significant Relationships:  Adult Children, Other Family Members, Siblings Lives with:  Adult Children Do you feel safe going back to the place where you live?  Yes Need for family participation in patient care:  Yes (Comment)  Care giving concerns:  Pt has had "5 back surgeries" and declined SNF each time per son. Pt and pt son have discussed pt needs for SNF, requiring assistance with ADLs and IADLs.    Social Worker assessment / plan:  Pt spoke with pt, pt son, and pt sister. Pt reuqiring assistance with ADLs and IADLs. Pt son states that he has had multiple back surgeries and declined SNF- following this surgery pt in agreement that he needs assistance. Pt family requesting [facility] which is not in network with pt's insurance. Pt and family aware. CSW provided packet with other offers, discussed the fact that options are limited to which facilities accept insurance [facility], as well as having an available bed. Pt son and pt sister will look over choices and get back with pt asap in order to start insurance authorization. Pt family preference remains in [place].   Employment status:   Retired Database administratornsurance information:  Managed Medicare PT Recommendations:  24 Hour Supervision, Skilled Nursing Facility Information / Referral to community resources:     Patient/Family's Response to care:  Pt and pt family amenable to speaking with CSW, understanding options are limited to offers, and will f/u with choice.   Patient/Family's Understanding of and Emotional Response to Diagnosis, Current Treatment, and Prognosis:  Pt and pt family state understanding of diagnosis, current treatment and prognosis. Pt family visibly stressed by their lack of options- as evidenced by pt son comments of "I am tired, look at these gray hairs." Pt family however will do research and get back with CSW regarding choice as soon as they can.   Emotional Assessment Appearance:  Appears stated age Attitude/Demeanor/Rapport:  Unable to Assess Affect (typically observed):  Unable to Assess Orientation:  Oriented to Self, Fluctuating Orientation (Suspected and/or reported Sundowners) Alcohol / Substance use:  Not Applicable Psych involvement (Current and /or in the community):  No (Comment)  Discharge Needs  Concerns to be addressed:  Care Coordination Readmission within the last 30 days:  No Current discharge risk:  Physical Impairment, Dependent with Mobility, Cognitively Impaired Barriers to Discharge:  Insurance Authorization, No SNF bed, Continued Medical Work up   Dillard'ssabel H Halen Antenucci, LCSWA 02/27/2018, 11:36 AM

## 2018-02-27 NOTE — Progress Notes (Signed)
Physical Therapy Treatment Patient Details Name: Ryan Stone MRN: [id number] DOB: May 19, 1946 Today's Date: 02/27/2018    History of Present Illness 72 yo male admitted 9/20 for revision/extension posterior Lateral Fusion T9 - L3. Previous L2 to pelvis fusion. Previous L TKA 2018. Personal medical hx: HTN, Afib, TIA, CKD, gout.    PT Comments    Patient with limited tolerance to ambulation this session.  Reports does not know when he had medication last.  So possibly limited due to needing premedication prior to ambulation.  Feel he is appropriate for SNF level rehab upon d/c.  PT to follow acutely.   Follow Up Recommendations  SNF;Supervision/Assistance - 24 hour     Equipment Recommendations  None recommended by PT    Recommendations for Other Services       Precautions / Restrictions Precautions Precautions: Fall;Back Required Braces or Orthoses: Spinal Brace Spinal Brace: Applied in sitting position    Mobility  Bed Mobility Overal bed mobility: Needs Assistance Bed Mobility: Rolling;Sidelying to Sit Rolling: Min assist Sidelying to sit: Mod assist       General bed mobility comments: assist for lifting trunk  Transfers Overall transfer level: Needs assistance Equipment used: Rolling walker (2 wheeled) Transfers: Sit to/from Stand Sit to Stand: +2 physical assistance;Mod assist;From elevated surface         General transfer comment: cues for hand placement, attempted with +1 A pt sat back down and x 2 trials wtih 2 A prior to achieving upright  Ambulation/Gait Ambulation/Gait assistance: Mod assist;+2 safety/equipment Gait Distance (Feet): 8 Feet(& 5') Assistive device: Rolling walker (2 wheeled) Gait Pattern/deviations: Step-to pattern;Decreased stride length;Trunk flexed;Shuffle     General Gait Details: c/o burning in legs and unable to achieve upright posture with hips and knees fully extended, knees buckle with gait with chair close behind. sat to  rest and stood second trial with improved safety from chair with armrests   Stairs             Wheelchair Mobility    Modified Rankin (Stroke Patients Only)       Balance Overall balance assessment: Needs assistance Sitting-balance support: Feet supported Sitting balance-Leahy Scale: Fair     Standing balance support: Bilateral upper extremity supported;During functional activity Standing balance-Leahy Scale: Poor                              Cognition Arousal/Alertness: Awake/alert Behavior During Therapy: Flat affect Overall Cognitive Status: Impaired/Different from baseline Area of Impairment: Memory;Problem solving                     Memory: Decreased recall of precautions Following Commands: Follows one step commands with increased time     Problem Solving: Slow processing;Requires verbal cues;Requires tactile cues        Exercises Other Exercises Other Exercises: LE therex for "warm up" ankle DF/PF and knee flex ext x 5-10 each    General Comments General comments (skin integrity, edema, etc.): Needs assist to Siyon brace      Pertinent Vitals/Pain Faces Pain Scale: Hurts whole lot Pain Location: legs with standing activities Pain Descriptors / Indicators: Aching;Burning Pain Intervention(s): Monitored during session;Repositioned;Limited activity within patient's tolerance;Patient requesting pain meds-RN notified    Home Living                      Prior Function  PT Goals (current goals can now be found in the care plan section) Progress towards PT goals: Not progressing toward goals - comment    Frequency    Min 5X/week      PT Plan Current plan remains appropriate    Co-evaluation              AM-PAC PT "6 Clicks" Daily Activity  Outcome Measure  Difficulty turning over in bed (including adjusting bedclothes, sheets and blankets)?: Unable Difficulty moving from lying on back to  sitting on the side of the bed? : Unable Difficulty sitting down on and standing up from a chair with arms (e.g., wheelchair, bedside commode, etc,.)?: Unable Help needed moving to and from a bed to chair (including a wheelchair)?: A Lot Help needed walking in hospital room?: A Lot Help needed climbing 3-5 steps with a railing? : Total 6 Click Score: 8    End of Session Equipment Utilized During Treatment: Gait belt;Back brace Activity Tolerance: Patient limited by pain Patient left: with call bell/phone within reach;in chair;with chair alarm set Nurse Communication: Mobility status;Patient requests pain meds PT Visit Diagnosis: Other abnormalities of gait and mobility (R26.89);Muscle weakness (generalized) (M62.81)     Time: 4540-98111106-1125 PT Time Calculation (min) (ACUTE ONLY): 19 min  Charges:  $Gait Training: 8-22 mins                     Sheran LawlessCyndi Wynn, PT Acute Rehabilitation Services [phone] 02/27/2018    Elray Mcgregorynthia Wynn 02/27/2018, 2:30 PM

## 2018-02-27 NOTE — Progress Notes (Signed)
Postop day 3.  Patient mildly confused today.  Pain well controlled.  No lower extremity symptoms.  Beginning to mobilize with therapy.  Currently afebrile.  Vital signs are stable.  Motor and sensory function of the extremities normal.  Wound clean and dry.  Progressing reasonably well.  I suspect the patient's confusion is secondary to hospitalization and the extensive nature of his surgery.  Check follow-up labs today and also check UA.

## 2018-02-27 NOTE — NC FL2 (Signed)
[place] MEDICAID FL2 LEVEL OF CARE SCREENING TOOL     IDENTIFICATION  Patient Name: Ryan Stone Birthdate: 03-Apr-1946 Sex: male Admission Date (Current Location): 02/24/2018  [facility] [place]   Facility and Address:  [facility] [place], [address], [place], [place] [address]      Provider Number: [id number]  Attending Physician Name and Address:  Julio SicksPool, Henry, MD  Relative Name and Phone Number:  Radene KneeDonnie Mcelmurry, son, [phone]    Current Level of Care: Hospital Recommended Level of Care: Skilled Nursing Facility Prior Approval Number:    Date Approved/Denied:   PASRR Number: [id number]  Discharge Plan: SNF    Current Diagnoses: Patient Active Problem List   Diagnosis Date Noted  . 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    Orientation RESPIRATION BLADDER Height & Weight     Self  Normal Continent Weight: 225 lb (102.1 kg) Height:  5\' 11"  (180.3 cm)  BEHAVIORAL SYMPTOMS/MOOD NEUROLOGICAL BOWEL NUTRITION STATUS      Continent Diet(see discharge summary)  AMBULATORY STATUS COMMUNICATION OF NEEDS Skin   Extensive Assist Verbally Surgical wounds, Other (Comment)                       Personal Care Assistance Level of Assistance  Bathing, Dressing, Feeding Bathing Assistance: Maximum assistance Feeding assistance: Independent Dressing Assistance: Maximum assistance     Functional Limitations Info  Sight, Hearing, Speech Sight Info: Adequate Hearing Info: Adequate Speech Info: Adequate    SPECIAL CARE FACTORS FREQUENCY  PT (By licensed PT), OT (By licensed OT)     PT Frequency: 5x week OT Frequency: 5x week            Contractures Contractures Info: Not present    Additional Factors Info  Code Status, Allergies Code Status Info: Full Code Allergies Info: Amlodipine           Current Medications (02/27/2018):  This is the current hospital active medication list Current Facility-Administered Medications   Medication Dose Route Frequency Provider Last Rate Last Dose  . 0.9 %  sodium chloride infusion  250 mL Intravenous Continuous Julio SicksPool, Henry, MD      . acetaminophen (TYLENOL) tablet 650 mg  650 mg Oral Q4H PRN Julio SicksPool, Henry, MD       Or  . acetaminophen (TYLENOL) suppository 650 mg  650 mg Rectal Q4H PRN Julio SicksPool, Henry, MD      . allopurinol (ZYLOPRIM) tablet 300 mg  300 mg Oral Daily Julio SicksPool, Henry, MD   300 mg at 02/26/18 0926  . amiodarone (PACERONE) tablet 200 mg  200 mg Oral Daily Julio SicksPool, Henry, MD   200 mg at 02/26/18 0926  . atorvastatin (LIPITOR) tablet 40 mg  40 mg Oral Annita BrodQHS Pool, Henry, MD   40 mg at 02/26/18 2104  . bisacodyl (DULCOLAX) suppository 10 mg  10 mg Rectal Daily PRN Julio SicksPool, Henry, MD      . carvedilol (COREG) tablet 25 mg  25 mg Oral BID Julio SicksPool, Henry, MD   25 mg at 02/26/18 2104  . cloNIDine (CATAPRES - Dosed in mg/24 hr) patch 0.2 mg  0.2 mg Transdermal Q Tana FeltsFri Pool, Henry, MD   0.2 mg at 02/24/18 1839  . diazepam (VALIUM) tablet 5-10 mg  5-10 mg Oral Q6H PRN Julio SicksPool, Henry, MD   5 mg at 02/26/18 2103  . dutasteride (AVODART) capsule 0.5 mg  0.5 mg Oral Daily Julio SicksPool, Henry, MD   0.5 mg at 02/26/18 0926  .  gabapentin (NEURONTIN) capsule 100 mg  100 mg Oral TID Julio Sicks, MD   100 mg at 02/26/18 2104  . gatifloxacin (ZYMAXID) 0.5 % ophthalmic drops 1 drop  1 drop Both Eyes QID Julio Sicks, MD   1 drop at 02/26/18 2107  . hydrALAZINE (APRESOLINE) tablet 100 mg  100 mg Oral Q8H Julio Sicks, MD   100 mg at 02/27/18 0518  . HYDROcodone-acetaminophen (NORCO) 10-325 MG per tablet 1 tablet  1 tablet Oral Q4H PRN Julio Sicks, MD      . HYDROmorphone (DILAUDID) injection 1 mg  1 mg Intravenous Q2H PRN Julio Sicks, MD   1 mg at 02/26/18 0141  . isosorbide mononitrate (IMDUR) 24 hr tablet 120 mg  120 mg Oral Daily Julio Sicks, MD   120 mg at 02/26/18 0926  . ketorolac (ACULAR) 0.5 % ophthalmic solution 1 drop  1 drop Both Eyes QID Julio Sicks, MD   1 drop at 02/26/18 2107  . levothyroxine (SYNTHROID,  LEVOTHROID) tablet 100 mcg  100 mcg Oral QAC breakfast Julio Sicks, MD   100 mcg at 02/27/18 0732  . losartan (COZAAR) tablet 100 mg  100 mg Oral Daily Julio Sicks, MD   100 mg at 02/26/18 0927  . loteprednol (LOTEMAX) 0.5 % ophthalmic suspension 1 drop  1 drop Both Eyes TID Julio Sicks, MD   1 drop at 02/26/18 2107  . menthol-cetylpyridinium (CEPACOL) lozenge 3 mg  1 lozenge Oral PRN Julio Sicks, MD       Or  . phenol (CHLORASEPTIC) mouth spray 1 spray  1 spray Mouth/Throat PRN Julio Sicks, MD      . ondansetron [facility]) tablet 4 mg  4 mg Oral Q6H PRN Julio Sicks, MD       Or  . ondansetron (ZOFRAN) injection 4 mg  4 mg Intravenous Q6H PRN Julio Sicks, MD      . oxyCODONE (Oxy IR/ROXICODONE) immediate release tablet 10 mg  10 mg Oral Q3H PRN Julio Sicks, MD   10 mg at 02/26/18 2104  . polyethylene glycol (MIRALAX / GLYCOLAX) packet 17 g  17 g Oral Daily PRN Julio Sicks, MD      . sodium chloride flush (NS) 0.9 % injection 3 mL  3 mL Intravenous Q12H Julio Sicks, MD   3 mL at 02/26/18 2140  . sodium chloride flush (NS) 0.9 % injection 3 mL  3 mL Intravenous PRN Julio Sicks, MD      . sodium phosphate (FLEET) 7-19 GM/118ML enema 1 enema  1 enema Rectal Once PRN Julio Sicks, MD      . tamsulosin [facility]) capsule 0.4 mg  0.4 mg Oral QHS Julio Sicks, MD   0.4 mg at 02/26/18 2104     Discharge Medications: Please see discharge summary for a list of discharge medications.  Relevant Imaging Results:  Relevant Lab Results:   Additional Information SS# [id number] [address] [place], [place]

## 2018-02-27 NOTE — Anesthesia Postprocedure Evaluation (Signed)
Anesthesia Post Note  Patient: Orvilla Cornwallon A Breeland  Procedure(s) Performed: Posterior Lateral Fusion Thoracic nine - Lumbar four (N/A Back)     Patient location during evaluation: PACU Anesthesia Type: General Level of consciousness: awake and alert Pain management: pain level controlled Vital Signs Assessment: post-procedure vital signs reviewed and stable Respiratory status: spontaneous breathing, nonlabored ventilation, respiratory function stable and patient connected to nasal cannula oxygen Cardiovascular status: blood pressure returned to baseline and stable Postop Assessment: no apparent nausea or vomiting Anesthetic complications: no    Last Vitals:  Vitals:   02/27/18 0300 02/27/18 0735  BP: (!) 148/72 (!) 162/80  Pulse: 66 63  Resp:  16  Temp: 36.6 C 36.8 C  SpO2: 98% 99%    Last Pain:  Vitals:   02/27/18 0800  TempSrc:   PainSc: Asleep                 Trevor IhaStephen A Devery Odwyer

## 2018-02-27 NOTE — Social Work (Addendum)
CSW acknowledging consult and preference for pt to go to rehab at [facility]. CSW has called and left message for [place] admissions. If [facility] is [facility] and able to offer placement will initiate authorization through [facility].   Await call back.  11:25am- CSW received call back, [facility] is not in network with [facility], called pt son and informed him of this.   Doy HutchingIsabel H Bobbie Valletta, LCSWA [facility] Clinical Social Work [phone]

## 2018-02-27 NOTE — Care Management Important Message (Signed)
Important Message  Patient Details  Name: Ryan Stone MRN: [id number] Date of Birth: April 30, 1946   Medicare Important Message Given:  Yes    Caleel Kiner Stefan ChurchBratton 02/27/2018, 2:46 PM

## 2018-02-28 MED ORDER — OXYCODONE HCL 10 MG PO TABS: 10.0000 mg | ORAL_TABLET | ORAL | 0 refills | Status: DC | PRN

## 2018-02-28 MED ORDER — DIAZEPAM 5 MG PO TABS: 5.0000 mg | ORAL_TABLET | Freq: Four times a day (QID) | ORAL | 0 refills | Status: DC | PRN

## 2018-02-28 MED FILL — Heparin Sodium (Porcine) Inj 1000 Unit/ML: INTRAMUSCULAR | Qty: 30 | Status: AC

## 2018-02-28 MED FILL — Sodium Chloride IV Soln 0.9%: INTRAVENOUS | Qty: 1000 | Status: AC

## 2018-02-28 NOTE — Progress Notes (Addendum)
Son at bedside, states he would not like his father to have valium any more at this point d/t valium giving patient confusion. Valium listed as allergy at this time in chart.  MD to be notified to see if prescription can be changed to other muscle relaxer to be discharged.  Continue to monitor.   1700 upon further discussion with son, son states pt has had confusion with any medication in benzodiazapine class.

## 2018-02-28 NOTE — Social Work (Signed)
Auth still pending for admission to SNF.    Doy HutchingIsabel H Delvin Hedeen, LCSWA [facility] Clinical Social Work [phone]

## 2018-02-28 NOTE — Progress Notes (Signed)
Physical Therapy Treatment Patient Details Name: TERRACE FONTANILLA MRN: [id number] DOB: [birth year] Today's Date: 02/28/2018    History of Present Illness 72 yo male admitted 9/20 for revision/extension posterior Lateral Fusion T9 - L3. Previous L2 to pelvis fusion. Previous L TKA 2018. Personal medical hx: HTN, Afib, TIA, CKD, gout.    PT Comments    Pt mildly delirious from valium.  Education reinforced to son more than pt.  Emphasized ambulation today with cues and assist for improved posture.    Follow Up Recommendations  SNF;Supervision/Assistance - 24 hour     Equipment Recommendations  None recommended by PT    Recommendations for Other Services       Precautions / Restrictions Precautions Precautions: Fall;Back Precaution Booklet Issued: Yes (comment) Spinal Brace: Applied in sitting position    Mobility  Bed Mobility               General bed mobility comments: pt in sitting on arrival  Transfers Overall transfer level: Needs assistance Equipment used: Rolling walker (2 wheeled) Transfers: Sit to/from Stand Sit to Stand: Min assist            Ambulation/Gait Ambulation/Gait assistance: Mod assist;+2 safety/equipment Gait Distance (Feet): 60 Feet Assistive device: Rolling walker (2 wheeled) Gait Pattern/deviations: Step-through pattern;Decreased step length - right;Decreased step length - left;Decreased stride length Gait velocity: decreased  Gait velocity interpretation: <1.31 ft/sec, indicative of household ambulator General Gait Details: significantly flexed posture with moderate stability assist.   Stairs             Wheelchair Mobility    Modified Rankin (Stroke Patients Only)       Balance Overall balance assessment: Needs assistance Sitting-balance support: Feet supported Sitting balance-Leahy Scale: Fair       Standing balance-Leahy Scale: Poor                              Cognition Arousal/Alertness:  Awake/alert Behavior During Therapy: Flat affect Overall Cognitive Status: Impaired/Different from baseline Area of Impairment: Memory;Problem solving                     Memory: Decreased recall of precautions Following Commands: Follows one step commands with increased time(and repetition)     Problem Solving: Slow processing;Difficulty sequencing;Requires verbal cues;Requires tactile cues General Comments: Cues have to be repeated, pt unable to remember back precautions      Exercises      General Comments General comments (skin integrity, edema, etc.): Reinforced the basics of back care to pt and made sure his son got the majority of education due to pt unable to remember education      Pertinent Vitals/Pain Pain Assessment: Faces(Simultaneous filing. User may not have seen previous data.) Faces Pain Scale: Hurts little more Pain Location: R leg down to the knee Pain Descriptors / Indicators: Burning;Discomfort Pain Intervention(s): Monitored during session(Simultaneous filing. User may not have seen previous data.)    Home Living                      Prior Function            PT Goals (current goals can now be found in the care plan section) Acute Rehab PT Goals Patient Stated Goal: return home  PT Goal Formulation: With patient Time For Goal Achievement: 03/10/18 Potential to Achieve Goals: Fair Progress towards PT goals: Progressing toward goals  Frequency    Min 5X/week      PT Plan Current plan remains appropriate    Co-evaluation   Reason for Co-Treatment: Complexity of the patient's impairments (multi-system involvement);For patient/therapist safety;To address functional/ADL transfers   OT goals addressed during session: ADL's and self-care      AM-PAC PT "6 Clicks" Daily Activity  Outcome Measure  Difficulty turning over in bed (including adjusting bedclothes, sheets and blankets)?: Unable Difficulty moving from lying on  back to sitting on the side of the bed? : Unable Difficulty sitting down on and standing up from a chair with arms (e.g., wheelchair, bedside commode, etc,.)?: Unable Help needed moving to and from a bed to chair (including a wheelchair)?: A Lot Help needed walking in hospital room?: A Lot Help needed climbing 3-5 steps with a railing? : Total 6 Click Score: 8    End of Session Equipment Utilized During Treatment: Back brace Activity Tolerance: Patient limited by pain Patient left: with call bell/phone within reach;in chair;with chair alarm set Nurse Communication: Mobility status PT Visit Diagnosis: Other abnormalities of gait and mobility (R26.89);Pain Pain - part of body: (back)     Time: [id number] PT Time Calculation (min) (ACUTE ONLY): 24 min  Charges:  $Gait Training: 8-22 mins                     02/28/2018  Labette BingKen Arion Shankles, PT Acute Rehabilitation Services [phone]  (pager) [phone]  (office)   Eliseo GumKenneth V Cruzito Standre 02/28/2018, 4:57 PM

## 2018-02-28 NOTE — Discharge Instructions (Signed)

## 2018-02-28 NOTE — Progress Notes (Signed)
Occupational Therapy Treatment Patient Details Name: Ryan Stone MRN: [id number] DOB: 25-Oct-1945 Today's Date: 02/28/2018    History of present illness 72 yo male admitted 9/20 for revision/extension posterior Lateral Fusion T9 - L3. Previous L2 to pelvis fusion. Previous L TKA 2018. Personal medical hx: HTN, Afib, TIA, CKD, gout.   OT comments  Pt making steady progress. Ambulated @ 60 ft @ RW level with +2 A for safety. Cognition improving but not at baseline. Continue to feel pt will benefit form rehab at SNF to facilitate safe DC home.   Follow Up Recommendations  SNF;Supervision/Assistance - 24 hour    Equipment Recommendations  3 in 1 bedside commode;Other (comment)    Recommendations for Other Services      Precautions / Restrictions Precautions Precautions: Fall;Back Precaution Booklet Issued: Yes (comment) Spinal Brace: Other (comment)       Mobility Bed Mobility               General bed mobility comments: OOB in chair  Transfers Overall transfer level: Needs assistance Equipment used: Rolling walker (2 wheeled) Transfers: Sit to/from Stand Sit to Stand: Min assist              Balance     Sitting balance-Leahy Scale: Fair       Standing balance-Leahy Scale: Poor                             ADL either performed or assessed with clinical judgement   ADL Overall ADL's : Needs assistance/impaired     Grooming: Supervision/safety;Set up;Sitting Grooming Details (indicate cue type and reason): vc for back precatuions during grooming                             Functional mobility during ADLs: Minimal assistance;Cueing for safety;Cueing for sequencing;Rolling walker;+2 for safety/equipment       Vision       Perception     Praxis      Cognition Arousal/Alertness: Awake/alert Behavior During Therapy: Flat affect Overall Cognitive Status: Impaired/Different from baseline Area of Impairment: Memory;Problem  solving                     Memory: Decreased recall of precautions Following Commands: Follows one step commands consistently     Problem Solving: Slow processing General Comments: improved cognition but not at baseline        Exercises     Shoulder Instructions       General Comments      Pertinent Vitals/ Pain       Pain Assessment: Faces Faces Pain Scale: Hurts little more Pain Location: legs with standing activities Pain Descriptors / Indicators: Aching;Burning Pain Intervention(s): Limited activity within patient's tolerance  Home Living                                          Prior Functioning/Environment              Frequency  Min 2X/week        Progress Toward Goals  OT Goals(current goals can now be found in the care plan section)  Progress towards OT goals: Progressing toward goals  Acute Rehab OT Goals Patient Stated Goal: return home  OT Goal Formulation: With patient Time For  Goal Achievement: 03/11/18 Potential to Achieve Goals: Good ADL Goals Pt Will Perform Grooming: with min guard assist;standing Pt Will Perform Lower Body Bathing: with min guard assist;with adaptive equipment;sit to/from stand Pt Will Perform Upper Body Dressing: with min guard assist;sitting Pt Will Perform Lower Body Dressing: with adaptive equipment;sit to/from stand;with min assist Pt Will Transfer to Toilet: with min guard assist;ambulating;bedside commode Pt Will Perform Toileting - Clothing Manipulation and hygiene: with min guard assist;sit to/from stand Additional ADL Goal #1: Pt will independently verbalize 3/3 back precautions. Additional ADL Goal #2: Pt will perform bed mobility with minA as precursor to EOB/OOB ADL.  Plan Discharge plan remains appropriate    Co-evaluation    PT/OT/SLP Co-Evaluation/Treatment: Yes Reason for Co-Treatment: Complexity of the patient's impairments (multi-system involvement);For  patient/therapist safety;To address functional/ADL transfers   OT goals addressed during session: ADL's and self-care      AM-PAC PT "6 Clicks" Daily Activity     Outcome Measure   Help from another person eating meals?: None Help from another person taking care of personal grooming?: A Little Help from another person toileting, which includes using toliet, bedpan, or urinal?: A Lot Help from another person bathing (including washing, rinsing, drying)?: A Lot Help from another person to put on and taking off regular upper body clothing?: A Little Help from another person to put on and taking off regular lower body clothing?: A Lot 6 Click Score: 16    End of Session Equipment Utilized During Treatment: Gait belt;Rolling walker  OT Visit Diagnosis: Other abnormalities of gait and mobility (R26.89);Unsteadiness on feet (R26.81)   Activity Tolerance Patient tolerated treatment well   Patient Left in chair;with call bell/phone within reach;with family/visitor present;with chair alarm set   Nurse Communication Mobility status        Time: [phone] OT Time Calculation (min): 24 min  Charges: OT General Charges $OT Visit: 1 Visit OT Treatments $Self Care/Home Management : 8-22 mins  Luisa DagoHilary Sanuel Ladnier, OT/L   Acute OT Clinical Specialist Acute Rehabilitation Services Pager [phone] Office [phone]    [facility]ILL[facility] 02/28/2018, 4:48 PM

## 2018-02-28 NOTE — Discharge Summary (Addendum)
Physician Discharge Summary  Patient ID: Ryan Stone MRN: [id number] DOB/AGE: 1945-09-22 72 y.o.  Admit date: 02/24/2018 Discharge date: 02/28/2018  Admission Diagnoses:  Discharge Diagnoses:  Active Problems:   Acquired kyphosis   Discharged Condition: good  Hospital Course: Patient was admitted to the hospital where he underwent uncomplicated revision/extension of his lumbar fusion into his mid thoracic spine.  Postoperatively he is doing very well.  His pain is much improved.  He is having no new lower extremity problems.  He is mobilizing with therapy.  Plan is for discharge to skilled nursing facility for further convalescence.  Consults:   Significant Diagnostic Studies:   Treatments:   Discharge Exam: Blood pressure 129/88, pulse 60, temperature 97.8 F (36.6 C), temperature source Oral, resp. rate 20, height 5\' 11"  (1.803 m), weight 102.1 kg, SpO2 96 %. Awake and alert.  Oriented and appropriate.  Cranial nerve function intact.  Motor and sensory function of the extremities normal.  Wound clean and dry.  Chest and abdomen benign.  Disposition: Discharge disposition: 03-Skilled Nursing Facility        Allergies as of 03/01/2018      Reactions   Amlodipine Nausea Only, Swelling   Leg swell   Benzodiazepines Other (See Comments)   "confusion" per family   Valium [diazepam] Other (See Comments)   "confusion"      Medication List    TAKE these medications   allopurinol 300 MG tablet Commonly known as:  ZYLOPRIM Take 300 mg by mouth daily.   amiodarone 200 MG tablet Commonly known as:  PACERONE Take 200 mg by mouth daily.   atorvastatin 40 MG tablet Commonly known as:  LIPITOR Take 40 mg by mouth at bedtime.   BESIVANCE 0.6 % Susp Generic drug:  Besifloxacin HCl Place 1 drop into both eyes 3 (three) times daily.   carvedilol 25 MG tablet Commonly known as:  COREG Take 25 mg by mouth 2 (two) times daily.   cloNIDine 0.2 mg/24hr patch Commonly  known as:  CATAPRES - Dosed in mg/24 hr Place 0.2 mg onto the skin every Monday.   cyclobenzaprine 5 MG tablet Commonly known as:  FLEXERIL Take 1 tablet (5 mg total) by mouth 3 (three) times daily as needed for muscle spasms.   DUREZOL 0.05 % Emul Generic drug:  Difluprednate Place 1 drop into both eyes 3 (three) times daily.   dutasteride 0.5 MG capsule Commonly known as:  AVODART Take 0.5 mg by mouth daily.   gabapentin 100 MG capsule Commonly known as:  NEURONTIN Take 100 mg by mouth 3 (three) times daily.   hydrALAZINE 100 MG tablet Commonly known as:  APRESOLINE Take 100 mg by mouth 3 (three) times daily.   isosorbide mononitrate 120 MG 24 hr tablet Commonly known as:  IMDUR Take 120 mg by mouth daily.   levothyroxine 100 MCG tablet Commonly known as:  SYNTHROID, LEVOTHROID Take 100 mcg by mouth daily before breakfast.   losartan 100 MG tablet Commonly known as:  COZAAR Take 100 mg by mouth daily.   Oxycodone HCl 10 MG Tabs Take 1 tablet (10 mg total) by mouth every 3 (three) hours as needed for severe pain ((score 7 to 10)).   PROLENSA 0.07 % Soln Generic drug:  Bromfenac Sodium Place 1 drop into both eyes at bedtime.   tamsulosin 0.4 MG Caps capsule Commonly known as:  FLOMAX Take 0.4 mg by mouth at bedtime.  Durable Medical Equipment  (From admission, onward)         Start     Ordered   02/24/18 1658  DME Walker rolling  Once    Question:  Patient needs a walker to treat with the following condition  Answer:  Acquired kyphosis   02/24/18 1657   02/24/18 1658  DME 3 n 1  Once     02/24/18 1657          Contact information for follow-up providers    Julio Sicks, MD Follow up in 3 week(s).   Specialty:  Neurosurgery Contact information: [address] [place] [address] [phone]            Contact information for after-discharge care    Destination    [facility] .    Service:  Skilled Nursing Contact information: [address] [place] [address] [phone]                  Signed: Temple Pacini 02/28/2018, 10:47 AM

## 2018-02-28 NOTE — Social Work (Signed)
CSW spoke with pt son Moise BoringDonnie on the phone, pt's family have chosen [facility] [place]. Have left message for Lorrie, admissions liaison with [facility] to initiate authorization [facility].   Doy HutchingIsabel H Kathryn Linarez, LCSWA [facility] Clinical Social Work [phone]

## 2018-02-28 NOTE — Progress Notes (Signed)
Overall patient doing better.  Confusion resolved.  Pain well managed.  Patient is frustrated with still being in the hospital and wants to move forward to rehab in [place].  Wound clean and dry.  Chest and abdomen benign.  Extremities free from swelling or other problem.  Neurologically awake and alert.  Oriented and appropriate.  Motor and sensory function intact.  Doing well overall.  Okay for discharge to skilled nursing facility.

## 2018-02-28 NOTE — Progress Notes (Signed)
Office paged to make MD aware prescriptions to be signed. At this time waiting for auth for SNF placement.

## 2018-03-01 MED ORDER — CYCLOBENZAPRINE HCL 5 MG PO TABS: 5.0000 mg | ORAL_TABLET | Freq: Three times a day (TID) | ORAL | 0 refills | Status: DC | PRN

## 2018-03-01 MED ORDER — OXYCODONE HCL 10 MG PO TABS: 10.0000 mg | ORAL_TABLET | ORAL | 0 refills | Status: AC | PRN

## 2018-03-01 MED ORDER — CYCLOBENZAPRINE HCL 5 MG PO TABS: 5.0000 mg | ORAL_TABLET | Freq: Three times a day (TID) | ORAL | 0 refills | Status: AC | PRN

## 2018-03-01 MED ORDER — CYCLOBENZAPRINE HCL 10 MG PO TABS
5.0000 mg | ORAL_TABLET | Freq: Three times a day (TID) | ORAL | Status: DC | PRN
  Administered 2018-03-03: 5 mg via ORAL
  Filled 2018-03-01: qty 1

## 2018-03-01 NOTE — Progress Notes (Signed)
Physical Therapy Treatment Patient Details Name: Ryan Stone MRN: [id number] DOB: 1945/12/02 Today's Date: 03/01/2018    History of Present Illness 72 yo male admitted 9/20 for revision/extension posterior Lateral Fusion T9 - L3. Previous L2 to pelvis fusion. Previous L TKA 2018. Personal medical hx: HTN, Afib, TIA, CKD, gout.    PT Comments    Making slow improvements, limited by decrease mentation and decreased focus on task.  Emphasis on transitions, improving posture and gait stability/stamina.   Follow Up Recommendations  SNF;Supervision/Assistance - 24 hour     Equipment Recommendations  None recommended by PT    Recommendations for Other Services       Precautions / Restrictions Precautions Precautions: Fall;Back Precaution Booklet Issued: Yes (comment) Precaution Comments: pt not able to remember precautions in his mild, but improving "loopiness" Required Braces or Orthoses: Spinal Brace Spinal Brace: Applied in sitting position Restrictions Weight Bearing Restrictions: No    Mobility  Bed Mobility               General bed mobility comments: OOB in the chair on arrival  Transfers Overall transfer level: Needs assistance Equipment used: Rolling walker (2 wheeled) Transfers: Sit to/from Stand Sit to Stand: Min assist         General transfer comment: cues for hand placement, setting up for standing, assist to both come forward and boost.  Ambulation/Gait Ambulation/Gait assistance: Mod assist;+2 safety/equipment Gait Distance (Feet): 40 Feet(then 35 feet after short sitting rest.) Assistive device: Rolling walker (2 wheeled) Gait Pattern/deviations: Step-through pattern Gait velocity: decreased  Gait velocity interpretation: <1.31 ft/sec, indicative of household ambulator General Gait Details: short, mildly uncoordinated steps with knee sagging R>L with fatigue.  Significantly flexed posture that is not corrected much with cuing and  facilitation.   Stairs             Wheelchair Mobility    Modified Rankin (Stroke Patients Only)       Balance Overall balance assessment: Needs assistance Sitting-balance support: Feet supported Sitting balance-Leahy Scale: Fair Sitting balance - Comments: balance maintain, but pt still not sitting upright     Standing balance-Leahy Scale: Poor Standing balance comment: needing external assist                            Cognition Arousal/Alertness: Awake/alert Behavior During Therapy: Flat affect Overall Cognitive Status: Impaired/Different from baseline Area of Impairment: Safety/judgement;Awareness;Problem solving                     Memory: Decreased recall of precautions Following Commands: Follows one step commands with increased time Safety/Judgement: Decreased awareness of safety   Problem Solving: Slow processing;Difficulty sequencing;Requires verbal cues;Requires tactile cues        Exercises      General Comments General comments (skin integrity, edema, etc.): Discussed expected progression of activity and general back prec.  pt still not able to internalized education yet.      Pertinent Vitals/Pain Pain Assessment: Faces Faces Pain Scale: Hurts little more Pain Location: R leg down to the knee Pain Descriptors / Indicators: Discomfort;Grimacing Pain Intervention(s): Monitored during session    Home Living                      Prior Function            PT Goals (current goals can now be found in the care plan section) Acute Rehab PT  Goals Patient Stated Goal: return home  PT Goal Formulation: With patient Time For Goal Achievement: 03/10/18 Potential to Achieve Goals: Fair Progress towards PT goals: Progressing toward goals    Frequency    Min 5X/week      PT Plan Current plan remains appropriate    Co-evaluation              AM-PAC PT "6 Clicks" Daily Activity  Outcome Measure   Difficulty turning over in bed (including adjusting bedclothes, sheets and blankets)?: Unable Difficulty moving from lying on back to sitting on the side of the bed? : Unable Difficulty sitting down on and standing up from a chair with arms (e.g., wheelchair, bedside commode, etc,.)?: Unable Help needed moving to and from a bed to chair (including a wheelchair)?: A Little Help needed walking in hospital room?: A Lot Help needed climbing 3-5 steps with a railing? : Total 6 Click Score: 9    End of Session   Activity Tolerance: Patient limited by pain Patient left: in chair;with call bell/phone within reach;with family/visitor present Nurse Communication: Mobility status PT Visit Diagnosis: Other abnormalities of gait and mobility (R26.89);Pain Pain - part of body: (leg/hip on right)     Time: 1100-1119 PT Time Calculation (min) (ACUTE ONLY): 19 min  Charges:  $Gait Training: 8-22 mins                     03/01/2018  Jessamine Bing, PT Acute Rehabilitation Services [phone]  (pager) [phone]  (office)   Ryan Stone 03/01/2018, 11:32 AM

## 2018-03-01 NOTE — Care Management Note (Signed)
Case Management Note  Patient Details  Name: KEIONDRE COLEE MRN: [id number] Date of Birth: 1945-11-29  Subjective/Objective:  72 yo male admitted 9/20 for revision/extension posterior Lateral Fusion T9 - L3.  PTA, pt independent, lives at home alone.  Has supportive son, sister.                 Action/Plan: PT/OT recommending SNF at dc; CSW following to facilitate dc to SNF upon medical stability and insurance authorization for SNF stay.    Expected Discharge Date:  02/28/18               Expected Discharge Plan:  Skilled Nursing Facility  In-House Referral:  Clinical Social Work  Discharge planning Services  CM Consult  Post Acute Care Choice:    Choice offered to:     DME Arranged:    DME Agency:     HH Arranged:    HH Agency:     Status of Service:  In process, will continue to follow  If discussed at Long Length of Stay Meetings, dates discussed:    Additional Comments:  Quintella Baton, RN, BSN  Trauma/Neuro ICU Case Manager [phone]

## 2018-03-01 NOTE — Social Work (Signed)
CSW spoke with Lorrie at [facility] [place] authorization continues to pend for pt to discharge for SNF.   Doy Hutching, LCSWA [facility] Clinical Social Work [phone]

## 2018-03-02 ENCOUNTER — Encounter (HOSPITAL_COMMUNITY): Payer: Self-pay | Admitting: Neurosurgery

## 2018-03-02 NOTE — Social Work (Addendum)
Additional clinical submitted. Pt authorization still pending.   3:18pm- Pt has received denial from [facility]. Monia Pouch states that pt has primary coverage through [employer] which is held "by his spouse." CSW unaware of spouse- Lorrie with [facility] will follow up with pt son regarding this potential coverage.   Doy Hutching, LCSWA [facility] Clinical Social Work [phone]

## 2018-03-02 NOTE — Progress Notes (Signed)
Overall stable.  Patient still awaiting nursing home placement.

## 2018-03-02 NOTE — Progress Notes (Signed)
Physical Therapy Treatment Patient Details Name: Ryan Stone MRN: [id number] DOB: 1946/02/14 Today's Date: 03/02/2018    History of Present Illness 72 yo male admitted 9/20 for revision/extension posterior Lateral Fusion T9 - L3. Previous L2 to pelvis fusion. Previous L TKA 2018. Personal medical hx: HTN, Afib, TIA, CKD, gout.    PT Comments    Slowed progress.  Pt's mentation improving now and pt now starting to be able to focus on task.  Emphasized gait, stressing upright posture which pt cannot attain as of yet.   Follow Up Recommendations  SNF;Supervision/Assistance - 24 hour     Equipment Recommendations  None recommended by PT    Recommendations for Other Services       Precautions / Restrictions Precautions Precautions: Fall;Back Precaution Booklet Issued: Yes (comment) Precaution Comments: still needs reinforcement of precautions Required Braces or Orthoses: Spinal Brace Spinal Brace: Applied in sitting position Restrictions Weight Bearing Restrictions: No(pt has trouble standing straight up standing)    Mobility  Bed Mobility                  Transfers Overall transfer level: Needs assistance Equipment used: Rolling walker (2 wheeled) Transfers: Sit to/from Stand Sit to Stand: Min assist         General transfer comment: cues for hand placement, especially for sitting  Ambulation/Gait Ambulation/Gait assistance: Mod assist;+2 safety/equipment Gait Distance (Feet): 70 Feet(x2) Assistive device: Rolling walker (2 wheeled) Gait Pattern/deviations: Step-through pattern Gait velocity: decreased  Gait velocity interpretation: <1.8 ft/sec, indicate of risk for recurrent falls General Gait Details: uncoordinated steps with r>L knee sag and 25-30* forward lean.   Stairs             Wheelchair Mobility    Modified Rankin (Stroke Patients Only)       Balance Overall balance assessment: Needs assistance   Sitting balance-Leahy Scale:  Fair       Standing balance-Leahy Scale: Poor Standing balance comment: needing external assist                            Cognition Arousal/Alertness: Awake/alert Behavior During Therapy: Flat affect Overall Cognitive Status: Impaired/Different from baseline(mentation slowly clearing)                       Memory: Decreased recall of precautions   Safety/Judgement: Decreased awareness of safety Awareness: Emergent Problem Solving: Slow processing        Exercises      General Comments General comments (skin integrity, edema, etc.): reinforcing basic back precautions      Pertinent Vitals/Pain Pain Assessment: Faces Faces Pain Scale: Hurts little more Pain Location: back Pain Descriptors / Indicators: Discomfort;Grimacing Pain Intervention(s): Monitored during session    Home Living                      Prior Function            PT Goals (current goals can now be found in the care plan section) Acute Rehab PT Goals Patient Stated Goal: return home  PT Goal Formulation: With patient Time For Goal Achievement: 03/10/18 Potential to Achieve Goals: Fair Progress towards PT goals: Progressing toward goals    Frequency    Min 5X/week      PT Plan Current plan remains appropriate    Co-evaluation              AM-PAC PT "  6 Clicks" Daily Activity  Outcome Measure  Difficulty turning over in bed (including adjusting bedclothes, sheets and blankets)?: Unable Difficulty moving from lying on back to sitting on the side of the bed? : Unable Difficulty sitting down on and standing up from a chair with arms (e.g., wheelchair, bedside commode, etc,.)?: Unable Help needed moving to and from a bed to chair (including a wheelchair)?: A Little Help needed walking in hospital room?: A Lot Help needed climbing 3-5 steps with a railing? : Total 6 Click Score: 9    End of Session   Activity Tolerance: Patient limited by  pain Patient left: in chair;with call bell/phone within reach;with family/visitor present Nurse Communication: Mobility status PT Visit Diagnosis: Other abnormalities of gait and mobility (R26.89);Pain Pain - part of body: (back)     Time: 1610-9604 PT Time Calculation (min) (ACUTE ONLY): 18 min  Charges:  $Gait Training: 8-22 mins                     03/02/2018  [place] Bing, PT Acute Rehabilitation Services [phone]  (pager) [phone]  (office)   Eliseo Gum [place] Sherr 03/02/2018, 12:28 PM

## 2018-03-03 NOTE — Social Work (Addendum)
CSW spoke with pt son Abalos, pt has not received [place] authorization. Pt son states "he is miserable, at this point he needs to come home."   CSW discussed that if pt goes home with home health, [facility]ia Pouch may ultimately deny placement, or there is a small chance if he is approved that they can transition pt to [facility] at that point.   Pt son aware of all information and that he will need to provide assistance to pt at discharge in addition to home health services.   Pt son states understanding. Pt son gave permission for CSW to contact RN Case Manager, RN Case Manager Raynelle Fanning aware.   4:26pm- Per pt son he has gotten a call from [place] saying pt is approved. Lorrie with [facility] is confirming with pre-cert RN, unable to discharge pt until authorization information has been received by the facility.   Doy Hutching, LCSWA [facility] Clinical Social Work [phone]

## 2018-03-03 NOTE — Discharge Summary (Signed)
Physician Discharge Summary  Patient ID: Ryan Stone MRN: [id number] DOB/AGE: [birth year] 72 y.o.  Admit date: 02/24/2018 Discharge date: 03/03/2018  Admission Diagnoses:  Acquired kyphosis  Discharge Diagnoses:  Same Active Problems:   Acquired kyphosis  Discharged Condition: Stable  Hospital Course:  Ryan Stone is a 72 y.o. male who was admitted for the below procedure. There were no post operative complications. At time of discharge, pain was well controlled, ambulating with Pt/OT, tolerating po, voiding normal. Ready for discharge.  Treatments: Surgery Reexploration of lumbar fusion with reduction of patient's kyphotic deformity.  Posterior lateral arthrodesis utilizing segmental pedicle screw instrumentation from T9-L3 with morselized allograft, local autograft, bone morphogenic protein.   Discharge Exam: Blood pressure 119/60, pulse (!) 55, temperature 97.6 F (36.4 C), temperature source Axillary, resp. rate 16, height 5\' 11"  (1.803 m), weight 102.1 kg, SpO2 99 %. Awake, alert, oriented Speech fluent, appropriate CN grossly intact 5/5 BUE/BLE Wound c/d/i  Disposition: Discharge disposition: 03-Skilled Nursing Facility        Allergies as of 03/03/2018      Reactions   Amlodipine Nausea Only, Swelling   Leg swell   Benzodiazepines Other (See Comments)   "confusion" per family   Valium [diazepam] Other (See Comments)   "confusion"      Medication List    TAKE these medications   allopurinol 300 MG tablet Commonly known as:  ZYLOPRIM Take 300 mg by mouth daily.   amiodarone 200 MG tablet Commonly known as:  PACERONE Take 200 mg by mouth daily.   atorvastatin 40 MG tablet Commonly known as:  LIPITOR Take 40 mg by mouth at bedtime.   BESIVANCE 0.6 % Susp Generic drug:  Besifloxacin HCl Place 1 drop into both eyes 3 (three) times daily.   carvedilol 25 MG tablet Commonly known as:  COREG Take 25 mg by mouth 2 (two) times daily.    cloNIDine 0.2 mg/24hr patch Commonly known as:  CATAPRES - Dosed in mg/24 hr Place 0.2 mg onto the skin every Monday.   cyclobenzaprine 5 MG tablet Commonly known as:  FLEXERIL Take 1 tablet (5 mg total) by mouth 3 (three) times daily as needed for muscle spasms.   DUREZOL 0.05 % Emul Generic drug:  Difluprednate Place 1 drop into both eyes 3 (three) times daily.   dutasteride 0.5 MG capsule Commonly known as:  AVODART Take 0.5 mg by mouth daily.   gabapentin 100 MG capsule Commonly known as:  NEURONTIN Take 100 mg by mouth 3 (three) times daily.   hydrALAZINE 100 MG tablet Commonly known as:  APRESOLINE Take 100 mg by mouth 3 (three) times daily.   isosorbide mononitrate 120 MG 24 hr tablet Commonly known as:  IMDUR Take 120 mg by mouth daily.   levothyroxine 100 MCG tablet Commonly known as:  SYNTHROID, LEVOTHROID Take 100 mcg by mouth daily before breakfast.   losartan 100 MG tablet Commonly known as:  COZAAR Take 100 mg by mouth daily.   Oxycodone HCl 10 MG Tabs Take 1 tablet (10 mg total) by mouth every 3 (three) hours as needed ((score 7 to 10)).   PROLENSA 0.07 % Soln Generic drug:  Bromfenac Sodium Place 1 drop into both eyes at bedtime.   tamsulosin 0.4 MG Caps capsule Commonly known as:  FLOMAX Take 0.4 mg by mouth at bedtime.            Durable Medical Equipment  (From admission, onward)  Start     Ordered   02/24/18 1658  DME Walker rolling  Once    Question:  Patient needs a walker to treat with the following condition  Answer:  Acquired kyphosis   02/24/18 1657   02/24/18 1658  DME 3 n 1  Once     02/24/18 1657          Contact information for follow-up providers    Julio Sicks, MD Follow up in 3 week(s).   Specialty:  Neurosurgery Contact information: [address] [place] [address] [phone]            Contact information for after-discharge care    Destination    [facility] .   Service:  Skilled Nursing Contact information: [address] [place] [address] [phone]                  Signed: Alyson Ingles 03/03/2018, 4:34 PM

## 2018-03-03 NOTE — Progress Notes (Signed)
Neurosurgery Progress Note  No issues overnight.  Complains of mild back soreness but otherwise feels well Tolerating po. Voiding normal. Eager for d/c to start rehab  EXAM:  BP (!) 167/74 (BP Location: Right Arm)   Pulse 64   Temp 97.6 F (36.4 C) (Oral)   Resp 18   Ht 5\' 11"  (1.803 m)   Wt 102.1 kg   SpO2 100%   BMI 31.38 kg/m   Awake, alert, oriented  Speech fluent, appropriate  MAEW with good strength Incision c/d/i  PLAN Stable this am Awaiting SNF placement

## 2018-03-03 NOTE — Progress Notes (Signed)
Physical Therapy Treatment Patient Details Name: Ryan Stone MRN: [id number] DOB: 1945/11/06 Today's Date: 03/03/2018    History of Present Illness 72 yo male admitted 9/20 for revision/extension posterior Lateral Fusion T9 - L3. Previous L2 to pelvis fusion. Previous L TKA 2018. Personal medical hx: HTN, Afib, TIA, CKD, gout.    PT Comments    Slow improvements both in mentation and gait stability.  Posture remains a problem.    Follow Up Recommendations  SNF;Supervision/Assistance - 24 hour     Equipment Recommendations  None recommended by PT    Recommendations for Other Services       Precautions / Restrictions Precautions Precautions: Fall;Back Precaution Comments: still needs reinforcement of precautions Required Braces or Orthoses: Spinal Brace Spinal Brace: Applied in sitting position    Mobility  Bed Mobility               General bed mobility comments: OOB in the chair on arrival  Transfers Overall transfer level: Needs assistance Equipment used: Rolling walker (2 wheeled) Transfers: Sit to/from Stand Sit to Stand: Min assist         General transfer comment: cues for hand placement, especially for sitting  Ambulation/Gait Ambulation/Gait assistance: Min assist;+2 safety/equipment Gait Distance (Feet): 100 Feet(80 feet after brief sitting rest) Assistive device: Rolling walker (2 wheeled) Gait Pattern/deviations: Step-through pattern Gait velocity: decreased  Gait velocity interpretation: <1.31 ft/sec, indicative of household ambulator General Gait Details: more steady, but episodes of incoordination and little improvement in ability to stand more upright.   Stairs             Wheelchair Mobility    Modified Rankin (Stroke Patients Only)       Balance   Sitting-balance support: Feet supported Sitting balance-Leahy Scale: Fair Sitting balance - Comments: balance maintained, but pt still not sitting upright   Standing balance  support: Bilateral upper extremity supported;During functional activity Standing balance-Leahy Scale: Poor Standing balance comment: needing external assist                            Cognition Arousal/Alertness: Awake/alert Behavior During Therapy: WFL for tasks assessed/performed Overall Cognitive Status: Within Functional Limits for tasks assessed                                 General Comments: repetitive cuing, but much clearer mentation      Exercises      General Comments        Pertinent Vitals/Pain Pain Assessment: Faces Faces Pain Scale: Hurts even more Pain Location: back Pain Descriptors / Indicators: Discomfort;Grimacing Pain Intervention(s): Monitored during session    Home Living                      Prior Function            PT Goals (current goals can now be found in the care plan section) Acute Rehab PT Goals Patient Stated Goal: return home  PT Goal Formulation: With patient Time For Goal Achievement: 03/10/18 Potential to Achieve Goals: Fair Progress towards PT goals: Progressing toward goals    Frequency    Min 5X/week      PT Plan Current plan remains appropriate    Co-evaluation              AM-PAC PT "6 Clicks" Daily Activity  Outcome Measure  Difficulty turning over in bed (including adjusting bedclothes, sheets and blankets)?: Unable Difficulty moving from lying on back to sitting on the side of the bed? : Unable Difficulty sitting down on and standing up from a chair with arms (e.g., wheelchair, bedside commode, etc,.)?: Unable Help needed moving to and from a bed to chair (including a wheelchair)?: A Little Help needed walking in hospital room?: A Lot Help needed climbing 3-5 steps with a railing? : Total 6 Click Score: 9    End of Session Equipment Utilized During Treatment: Back brace Activity Tolerance: Patient tolerated treatment well;Patient limited by fatigue Patient left: in  chair;with call bell/phone within reach;with family/visitor present Nurse Communication: Mobility status PT Visit Diagnosis: Other abnormalities of gait and mobility (R26.89);Pain Pain - part of body: (back)     Time: 6295-2841 PT Time Calculation (min) (ACUTE ONLY): 18 min  Charges:  $Gait Training: 8-22 mins                     03/03/2018  Ryan Stone, PT Acute Rehabilitation Services [phone]  (pager) [phone]  (office)   Ryan Stone 03/03/2018, 1:55 PM

## 2018-03-03 NOTE — Social Work (Signed)
Due to discrepancies with pt [employer] plan, and another plan pt previously covered by Hilbert Corrigan with [facility] has reinitiated authorization.  Will discuss with pt family private pay vs. Home with home health while awaiting authorization or the potential of discharging to another facility that will accept 5 day LOG while awaiting authorization.   [facility] will not accept 5 day LOG while waiting on [employer] authorization.   Doy Hutching, LCSWA [facility] Clinical Social Work [phone]

## 2018-03-03 NOTE — Social Work (Addendum)
Pt auth confirmed.   Clinical Social Worker facilitated patient discharge including contacting patient family and facility to confirm patient discharge plans.  Clinical information faxed to facility and family agreeable with plan.  Pt son will transport to [facility]. RN to call [phone] with report  prior to discharge.  Clinical Social Worker will sign off for now as social work intervention is no longer needed. Please consult [place] again if new need arises.  Doy Hutching, LCSWA Clinical Social Worker

## 2018-03-03 NOTE — Progress Notes (Signed)
Pt discharged to SNF. Son agreeable to transfer pt via own vehicle. Report called and given to facility. IV removed. Paperwork reviewed with son and pt. Printed prescriptions given to pt son to hand to RN at [facility]. Pt escorted to vehicle via wheelchair.

## 2020-02-03 IMAGING — CT CT L SPINE W/O CM
1 of 6 series · 7 of 14 positions shown, 9 images · non-contrast
Comparison: Lumbar spine radiographs 12/06/2017

CLINICAL DATA: Chronic low back pain. Prior lumbar surgery.

EXAM:
CT LUMBAR SPINE WITHOUT CONTRAST
TECHNIQUE: Multidetector CT imaging of the lumbar spine was performed without
intravenous contrast administration. Multiplanar CT image
reconstructions were also generated.

[Series 4: l spine soft (person_name) · axial · 0.36mm/px · z∈[+1020,+1233]mm · 7 of 95 slices shown, 9 images]
[im 12/95  soft-tissue]
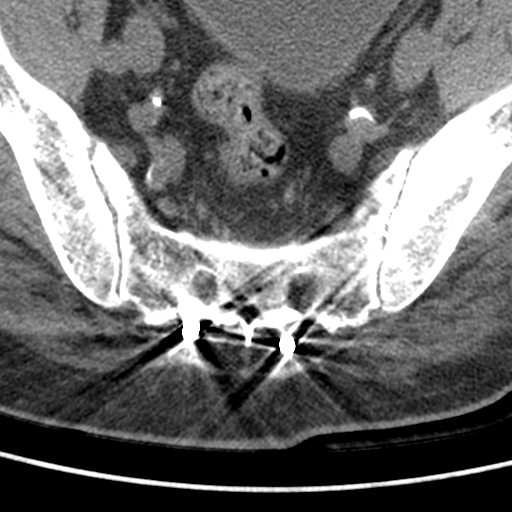
[im 12/95  bone]
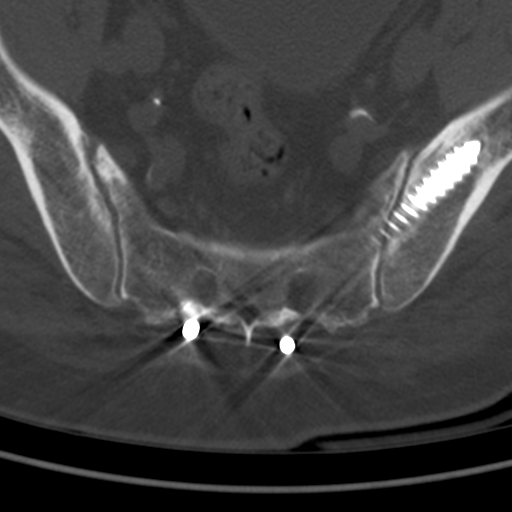
[im 24/95  bone]
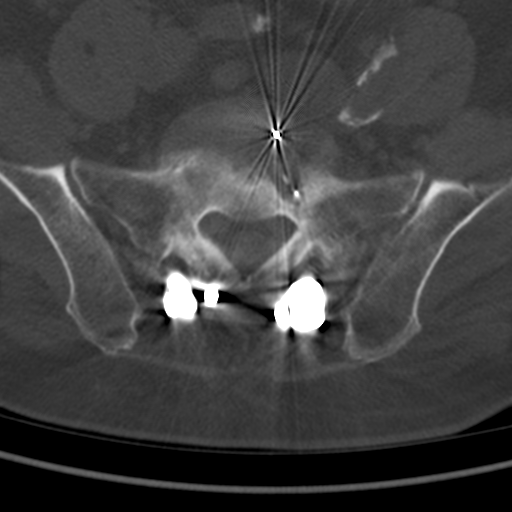
[im 36/95  bone]
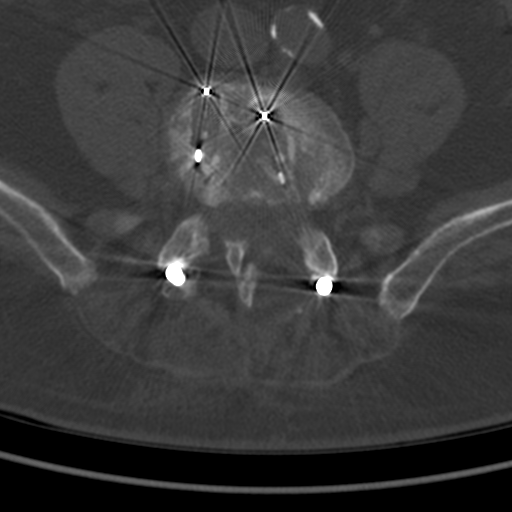
[im 48/95  bone]
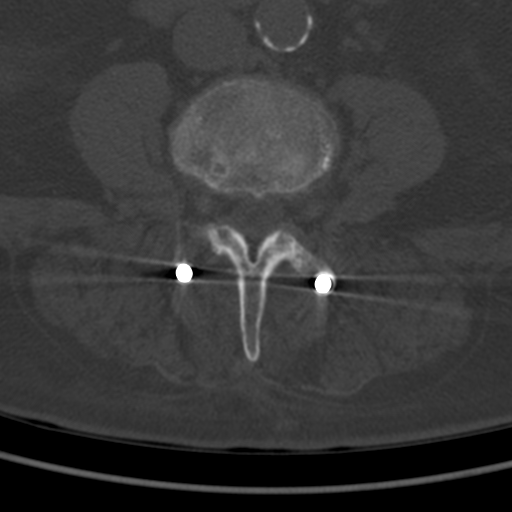
[im 59/95  soft-tissue]
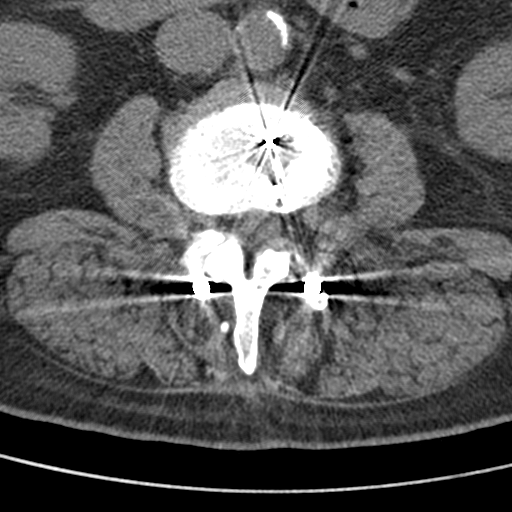
[im 59/95  bone]
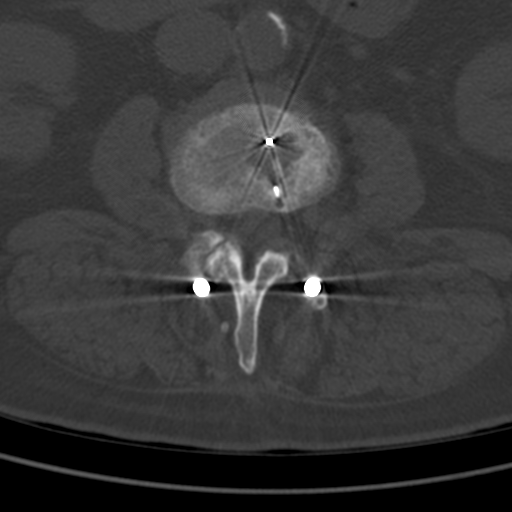
[im 71/95  bone]
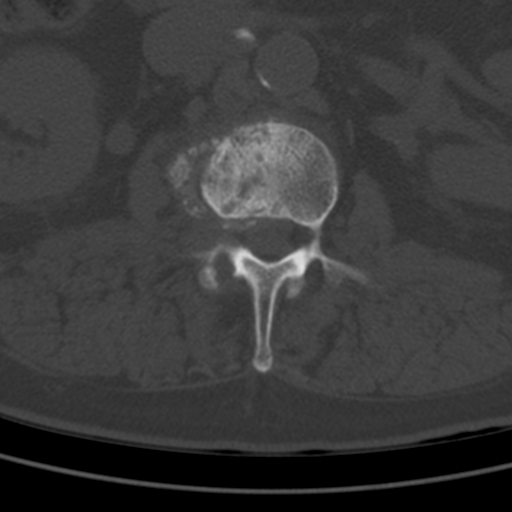
[im 83/95  bone]
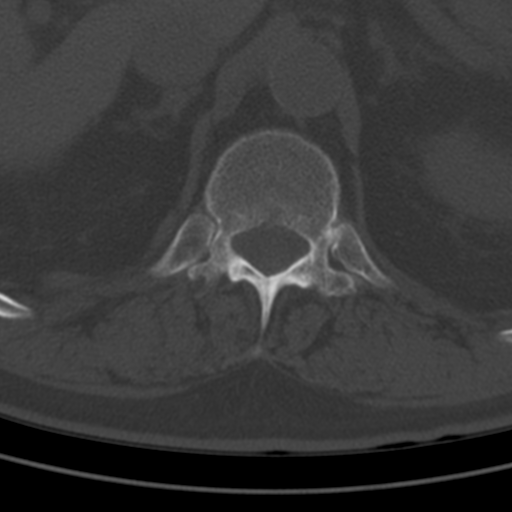

[7 of 14 positions shown; findings below may reference images not displayed]

FINDINGS: Segmentation: 5 lumbar type vertebrae.

Alignment: Slight S-shaped lumbar spine curvature. Mild left lateral
subluxation of L1 on L2. Mild straightening of the normal lumbar
lordosis.

Vertebrae: No acute fracture or suspicious osseous lesion is
identified. Sequelae of L2-S1 posterior fusion are identified. There
is asymmetric lucency about the right L2 pedicle screw, and both L2
screws breach the L2 superior endplate. There is also lucency about
the left L3, right L4, and bilateral S1 screws. Screws traverse both
SI joints and terminate in the iliac bones. Interbody implants are
present at L2-3, L4-5, and L5-S1 with subsidence at each level and
no evidence of solid osseous fusion.

Severe degenerative endplate changes are present at L1-2 including
endplate sclerosis, Schmorl's node formation, and erosion with
prominent vacuum disc and severe disc space height loss. Fragmented
bone is noted to the right of the L1 vertebral body. There is
moderate disc space narrowing at L3-4.

Paraspinal and other soft tissues: Prior cholecystectomy. Abdominal
aortic atherosclerosis without aneurysm.

Disc levels:

T11-12: Mild disc bulging and asymmetric left facet arthrosis
without stenosis.

T12-L1: Negative.

L1-2: Severe disc degeneration with circumferential displacement of
disc material and fragmented bone and facet and ligamentum flavum
hypertrophy result in severe spinal stenosis and severe right neural
foraminal stenosis. The facet joints are widened which, along with
vacuum disc, suggest instability at this level.

L2-3: Prior left laminectomy, partial facetectomy, and fusion. No
evidence of significant spinal or left neural foraminal stenosis.
Mild right neural foraminal stenosis due to endplate and facet
spurring.

L3-4: Prior posterior decompression and fusion. Disc bulging,
endplate spurring, and disc space height loss result in mild
bilateral neural foraminal stenosis without gross spinal stenosis.

L4-5: Prior posterior decompression and fusion. No evidence of
significant stenosis.

L5-S1: Prior posterior decompression and fusion. Disc bulging,
spurring, and disc space height loss result in mild-to-moderate left
neural foraminal stenosis. Limited assessment of the spinal canal.
IMPRESSION: 1. Extensive postsurgical changes from lumbosacral fusion with
evidence of loosening of multiple screws and lack of solid osseous
fusion.
2. Severe disc degeneration at L1-2 with findings suggesting
instability. Severe spinal and right neural foraminal stenosis.
3. Mild-to-moderate left neural foraminal stenosis at L5-S1.
4.  Aortic Atherosclerosis ([id number]).
